# Patient Record
Sex: Female | Born: 1975 | Race: Black or African American | Hispanic: No | Marital: Single | Smoking: Current every day smoker
Health system: Southern US, Community
[De-identification: ages and names within clinical notes are randomized; demographics above are authoritative.]

## PROBLEM LIST (undated history)

## (undated) DIAGNOSIS — I10 Essential (primary) hypertension: Secondary | ICD-10-CM

## (undated) DIAGNOSIS — G43909 Migraine, unspecified, not intractable, without status migrainosus: Secondary | ICD-10-CM

## (undated) HISTORY — PX: TUBAL LIGATION: SHX77

---

## 1998-06-11 ENCOUNTER — Other Ambulatory Visit: Admission: RE | Admit: 1998-06-11 | Discharge: 1998-06-11 | Payer: Self-pay | Admitting: Obstetrics

## 1998-07-04 ENCOUNTER — Ambulatory Visit (HOSPITAL_COMMUNITY): Admission: RE | Admit: 1998-07-04 | Discharge: 1998-07-04 | Payer: Self-pay | Admitting: Obstetrics

## 1998-07-24 ENCOUNTER — Inpatient Hospital Stay (HOSPITAL_COMMUNITY): Admission: AD | Admit: 1998-07-24 | Discharge: 1998-07-24 | Payer: Self-pay | Admitting: Obstetrics

## 1998-10-06 ENCOUNTER — Inpatient Hospital Stay (HOSPITAL_COMMUNITY): Admission: AD | Admit: 1998-10-06 | Discharge: 1998-10-08 | Payer: Self-pay | Admitting: Obstetrics

## 2000-03-12 ENCOUNTER — Emergency Department (HOSPITAL_COMMUNITY): Admission: EM | Admit: 2000-03-12 | Discharge: 2000-03-12 | Payer: Self-pay | Admitting: Emergency Medicine

## 2000-03-17 ENCOUNTER — Emergency Department (HOSPITAL_COMMUNITY): Admission: EM | Admit: 2000-03-17 | Discharge: 2000-03-17 | Payer: Self-pay | Admitting: Emergency Medicine

## 2000-03-28 ENCOUNTER — Other Ambulatory Visit: Admission: RE | Admit: 2000-03-28 | Discharge: 2000-03-28 | Payer: Self-pay | Admitting: Obstetrics

## 2000-09-13 ENCOUNTER — Other Ambulatory Visit: Admission: RE | Admit: 2000-09-13 | Discharge: 2000-09-13 | Payer: Self-pay | Admitting: Obstetrics

## 2013-11-28 ENCOUNTER — Emergency Department: Payer: Self-pay | Admitting: Internal Medicine

## 2013-11-28 LAB — URINALYSIS, COMPLETE
Bacteria: NONE SEEN
Bilirubin,UR: NEGATIVE
Glucose,UR: NEGATIVE mg/dL (ref 0–75)
Leukocyte Esterase: NEGATIVE
Nitrite: NEGATIVE
PH: 6 (ref 4.5–8.0)
Protein: NEGATIVE
RBC,UR: 8 /HPF (ref 0–5)
Specific Gravity: 1.017 (ref 1.003–1.030)
Squamous Epithelial: 3
WBC UR: 2 /HPF (ref 0–5)

## 2013-11-28 LAB — CBC WITH DIFFERENTIAL/PLATELET
BASOS ABS: 0.1 10*3/uL (ref 0.0–0.1)
Basophil %: 0.8 %
EOS ABS: 0.1 10*3/uL (ref 0.0–0.7)
Eosinophil %: 1.1 %
HCT: 39.2 % (ref 35.0–47.0)
HGB: 13.2 g/dL (ref 12.0–16.0)
Lymphocyte #: 3.2 10*3/uL (ref 1.0–3.6)
Lymphocyte %: 42.5 %
MCH: 30.4 pg (ref 26.0–34.0)
MCHC: 33.6 g/dL (ref 32.0–36.0)
MCV: 91 fL (ref 80–100)
MONOS PCT: 5.9 %
Monocyte #: 0.4 x10 3/mm (ref 0.2–0.9)
NEUTROS PCT: 49.7 %
Neutrophil #: 3.8 10*3/uL (ref 1.4–6.5)
Platelet: 298 10*3/uL (ref 150–440)
RBC: 4.34 10*6/uL (ref 3.80–5.20)
RDW: 12.7 % (ref 11.5–14.5)
WBC: 7.6 10*3/uL (ref 3.6–11.0)

## 2013-11-28 LAB — BASIC METABOLIC PANEL
ANION GAP: 6 — AB (ref 7–16)
BUN: 9 mg/dL (ref 7–18)
CHLORIDE: 102 mmol/L (ref 98–107)
CO2: 29 mmol/L (ref 21–32)
Calcium, Total: 8.3 mg/dL — ABNORMAL LOW (ref 8.5–10.1)
Creatinine: 0.59 mg/dL — ABNORMAL LOW (ref 0.60–1.30)
EGFR (Non-African Amer.): >60
Glucose: 74 mg/dL (ref 65–99)
Osmolality: 271 (ref 275–301)
Potassium: 3.3 mmol/L — ABNORMAL LOW (ref 3.5–5.1)
Sodium: 137 mmol/L (ref 136–145)

## 2013-11-28 LAB — TROPONIN I

## 2013-11-28 LAB — PREGNANCY, URINE: PREGNANCY TEST, URINE: NEGATIVE m[IU]/mL

## 2013-11-28 IMAGING — CT CT HEAD WITHOUT CONTRAST
1 series · 16 of 30 positions shown, 20 images · non-contrast
Comparison: None.

CLINICAL DATA: Dizziness

EXAM:
CT HEAD WITHOUT CONTRAST
TECHNIQUE: Contiguous axial images were obtained from the base of the skull
through the vertex without intravenous contrast.

[Series 2: head wo · axial · 0.40mm/px · z∈[+376,+505]mm · 16 of 30 slices shown, 20 images]
[im 2/30  brain]
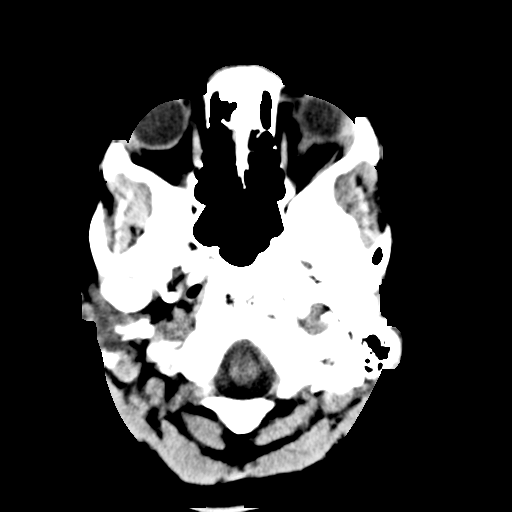
[im 2/30  bone]
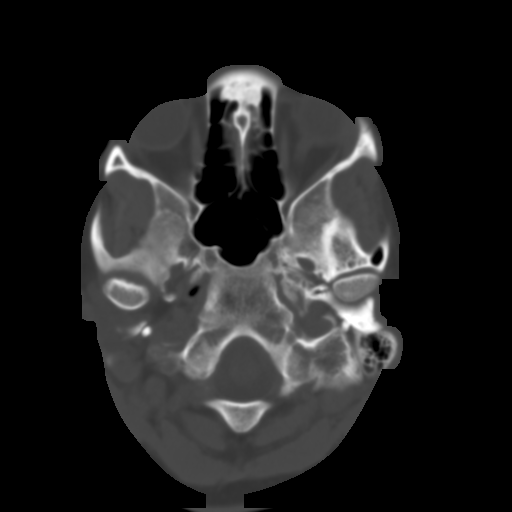
[im 4/30  brain]
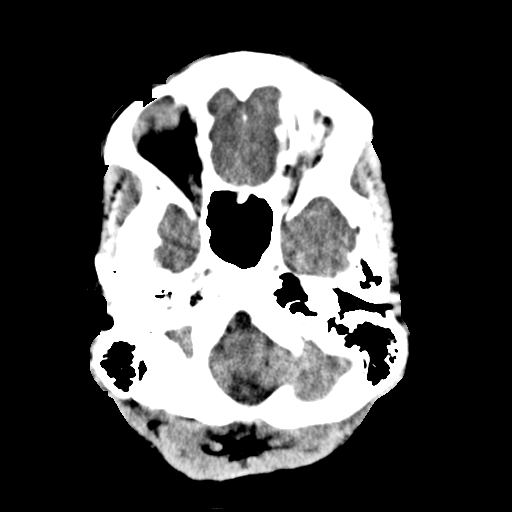
[im 6/30  brain]
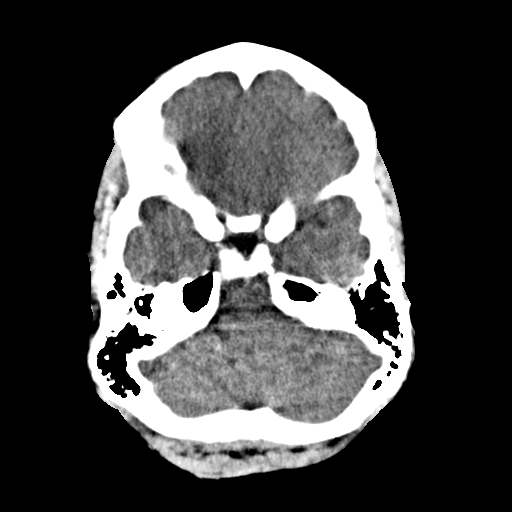
[im 8/30  brain]
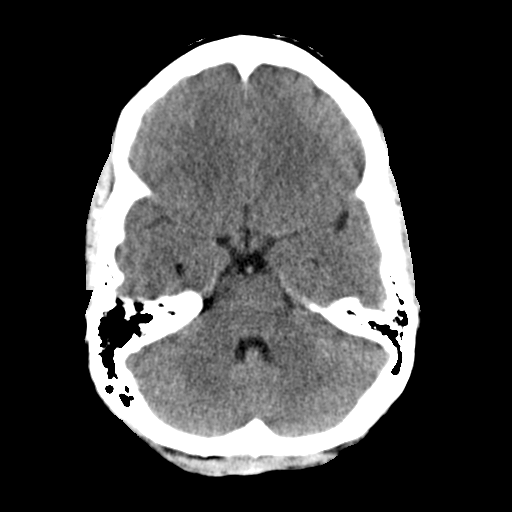
[im 9/30  brain]
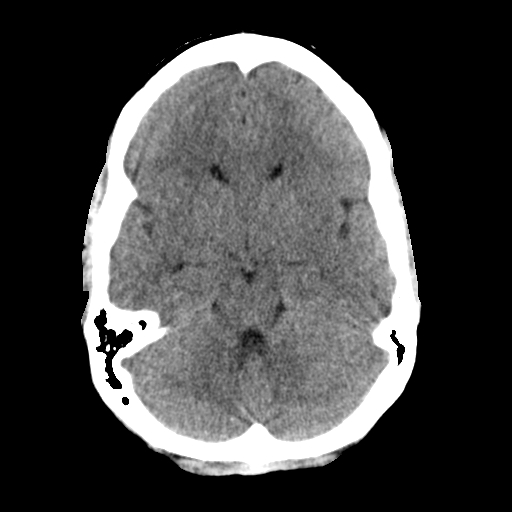
[im 9/30  bone]
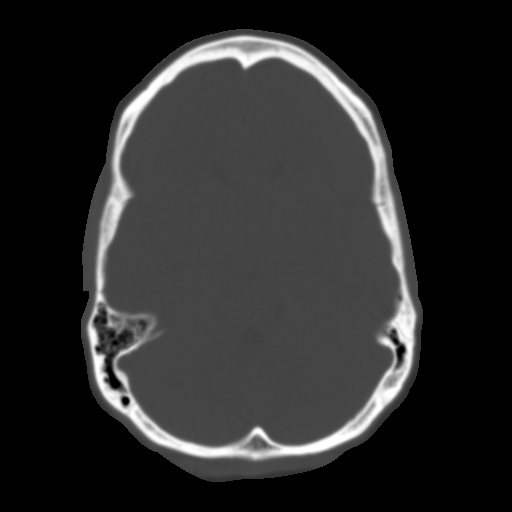
[im 11/30  brain]
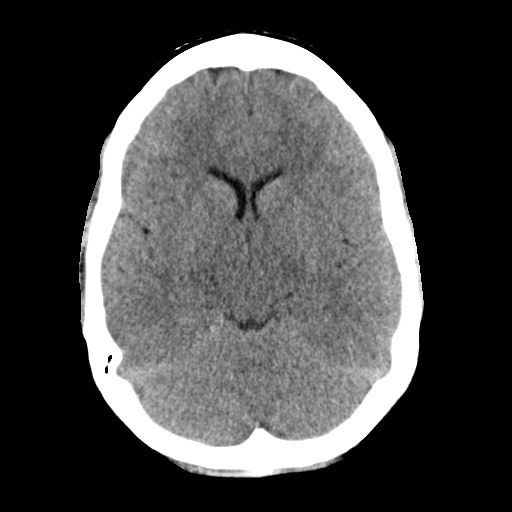
[im 13/30  brain]
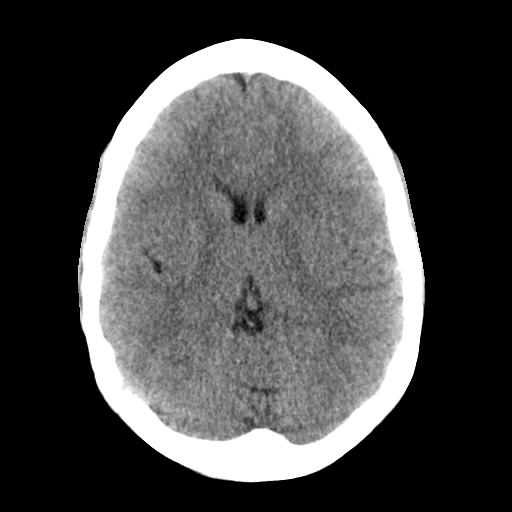
[im 15/30  brain]
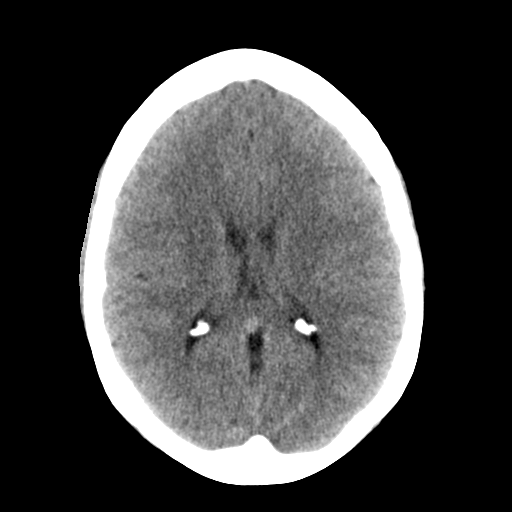
[im 16/30  brain]
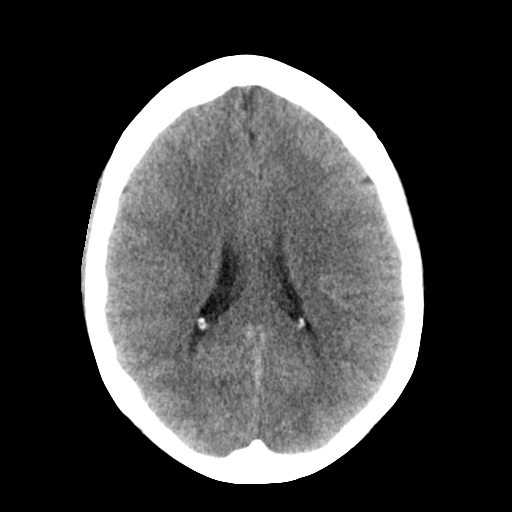
[im 16/30  bone]
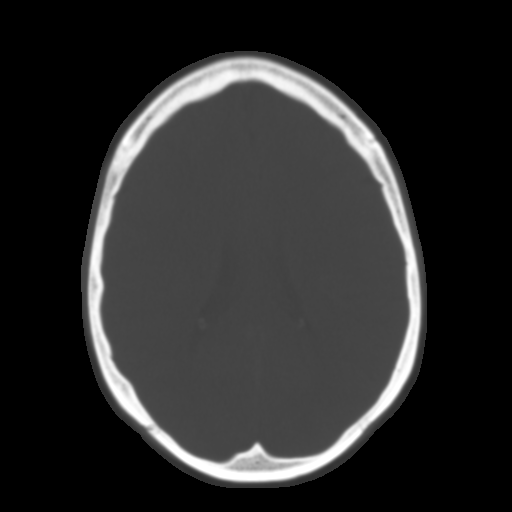
[im 18/30  brain]
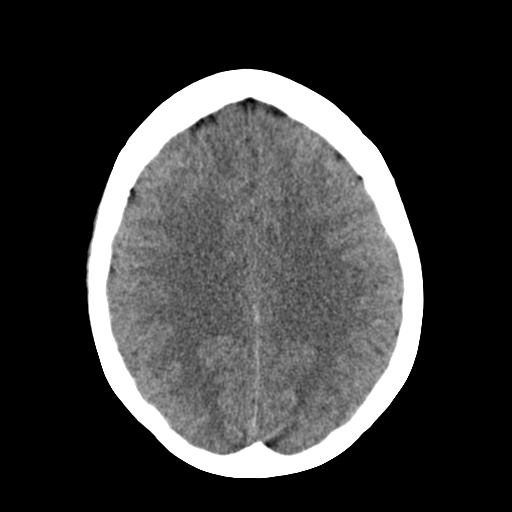
[im 20/30  brain]
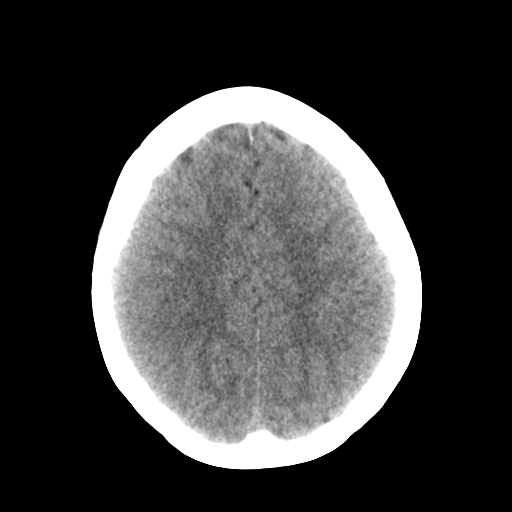
[im 22/30  brain]
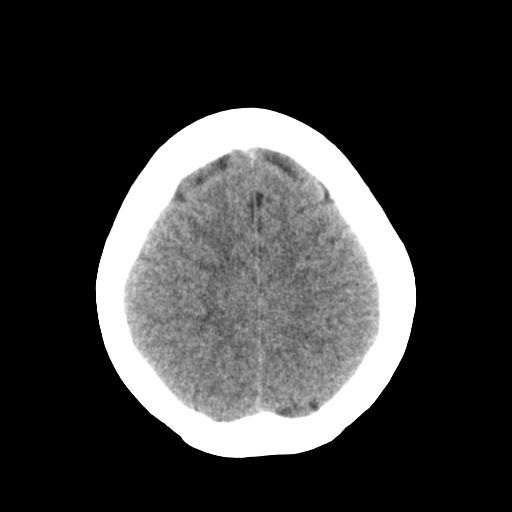
[im 23/30  brain]
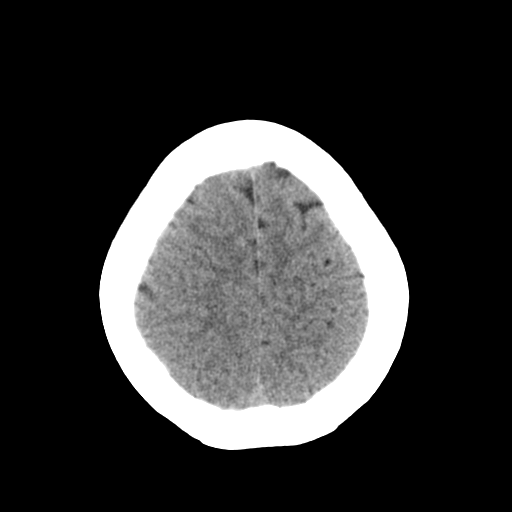
[im 23/30  bone]
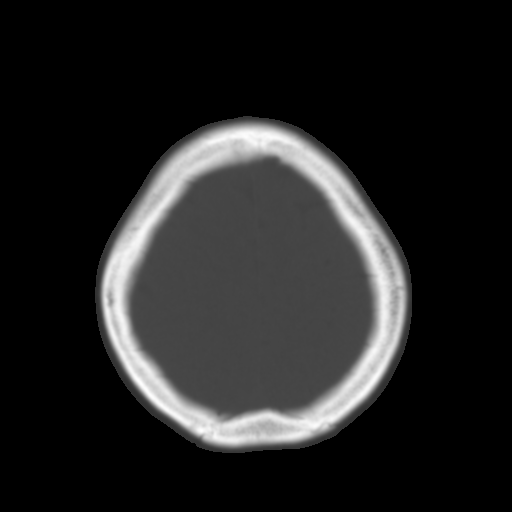
[im 25/30  brain]
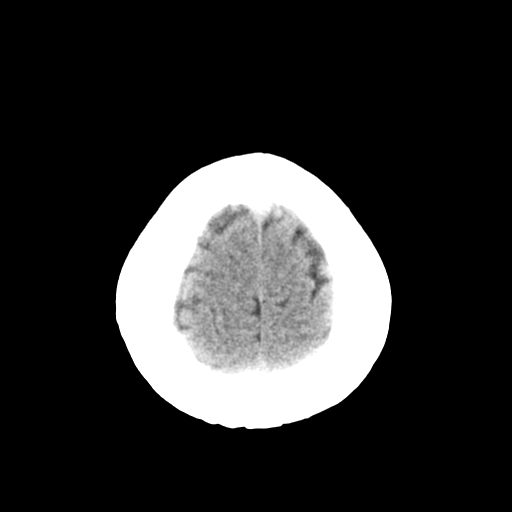
[im 27/30  brain]
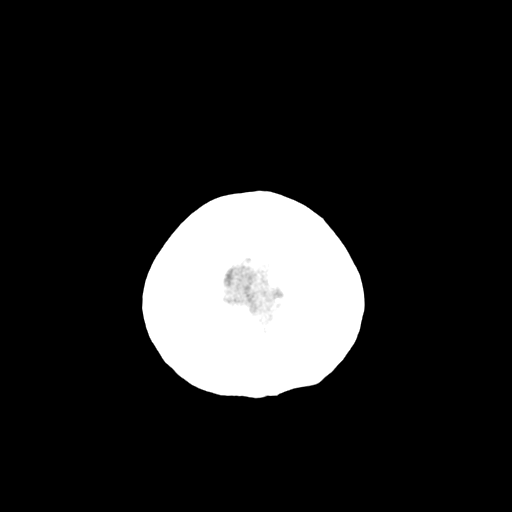
[im 29/30  brain]
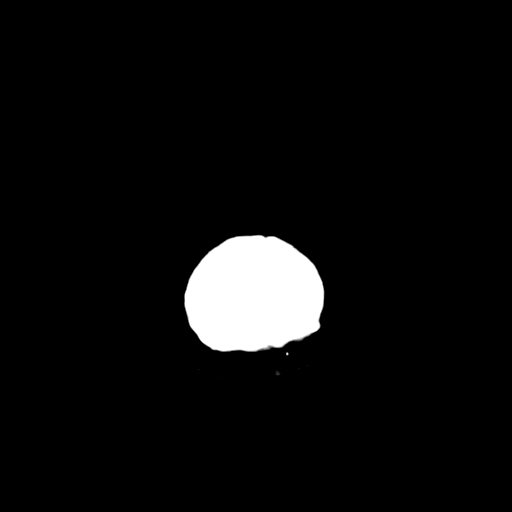

[16 of 30 positions shown; findings below may reference images not displayed]

FINDINGS: The ventricles are normal in size and configuration. There is no
mass, hemorrhage, extra-axial fluid collection, or midline shift.
The gray-white compartments appear normal. No demonstrable acute
infarct. Bony calvarium appears intact. The mastoid air cells are
clear.
IMPRESSION: Study within normal limits.

## 2014-07-17 ENCOUNTER — Emergency Department: Payer: Self-pay | Admitting: Emergency Medicine

## 2014-07-17 LAB — CBC
HCT: 39.4 % (ref 35.0–47.0)
HGB: 12.9 g/dL (ref 12.0–16.0)
MCH: 30.2 pg (ref 26.0–34.0)
MCHC: 32.8 g/dL (ref 32.0–36.0)
MCV: 92 fL (ref 80–100)
Platelet: 280 10*3/uL (ref 150–440)
RBC: 4.28 10*6/uL (ref 3.80–5.20)
RDW: 13.8 % (ref 11.5–14.5)
WBC: 7.6 10*3/uL (ref 3.6–11.0)

## 2014-07-17 LAB — BASIC METABOLIC PANEL
Anion Gap: 8 (ref 7–16)
BUN: 9 mg/dL (ref 7–18)
Calcium, Total: 8.3 mg/dL — ABNORMAL LOW (ref 8.5–10.1)
Chloride: 105 mmol/L (ref 98–107)
Co2: 27 mmol/L (ref 21–32)
Creatinine: 0.74 mg/dL (ref 0.60–1.30)
EGFR (African American): >60
GLUCOSE: 79 mg/dL (ref 65–99)
Osmolality: 277 (ref 275–301)
POTASSIUM: 3.7 mmol/L (ref 3.5–5.1)
SODIUM: 140 mmol/L (ref 136–145)

## 2014-07-17 LAB — TROPONIN I: Troponin-I: <0.02 ng/mL

## 2014-08-13 ENCOUNTER — Emergency Department: Payer: Self-pay | Admitting: Internal Medicine

## 2014-08-13 IMAGING — CR DG CHEST 2V
1 series · 2 of 2 positions shown · non-contrast
Comparison: None.

CLINICAL DATA: Chest pain starting yesterday

EXAM:
CHEST  2 VIEW

[Series 1: dxr chest pa (or ap) and lateral · 0.14mm/px · 2 of 2 slices shown]
[im 1/2]
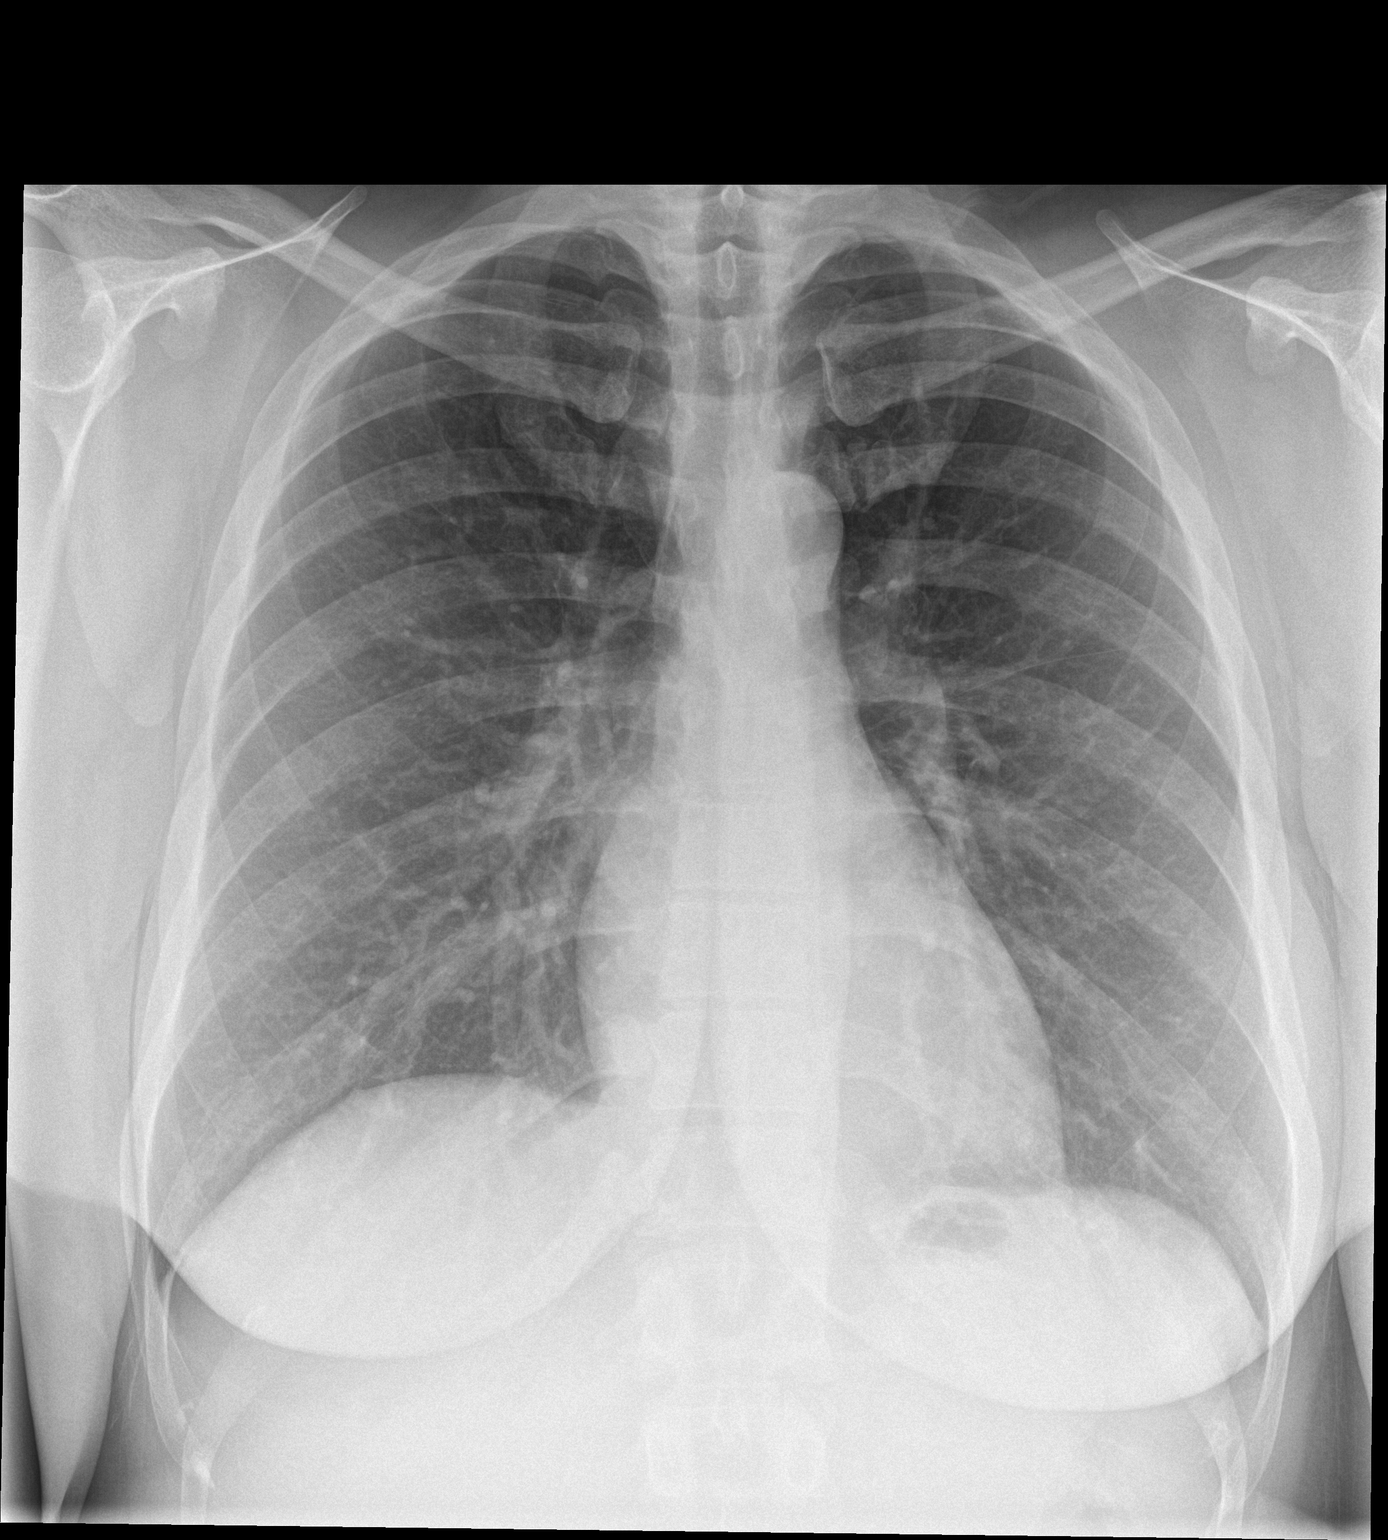
[im 2/2]
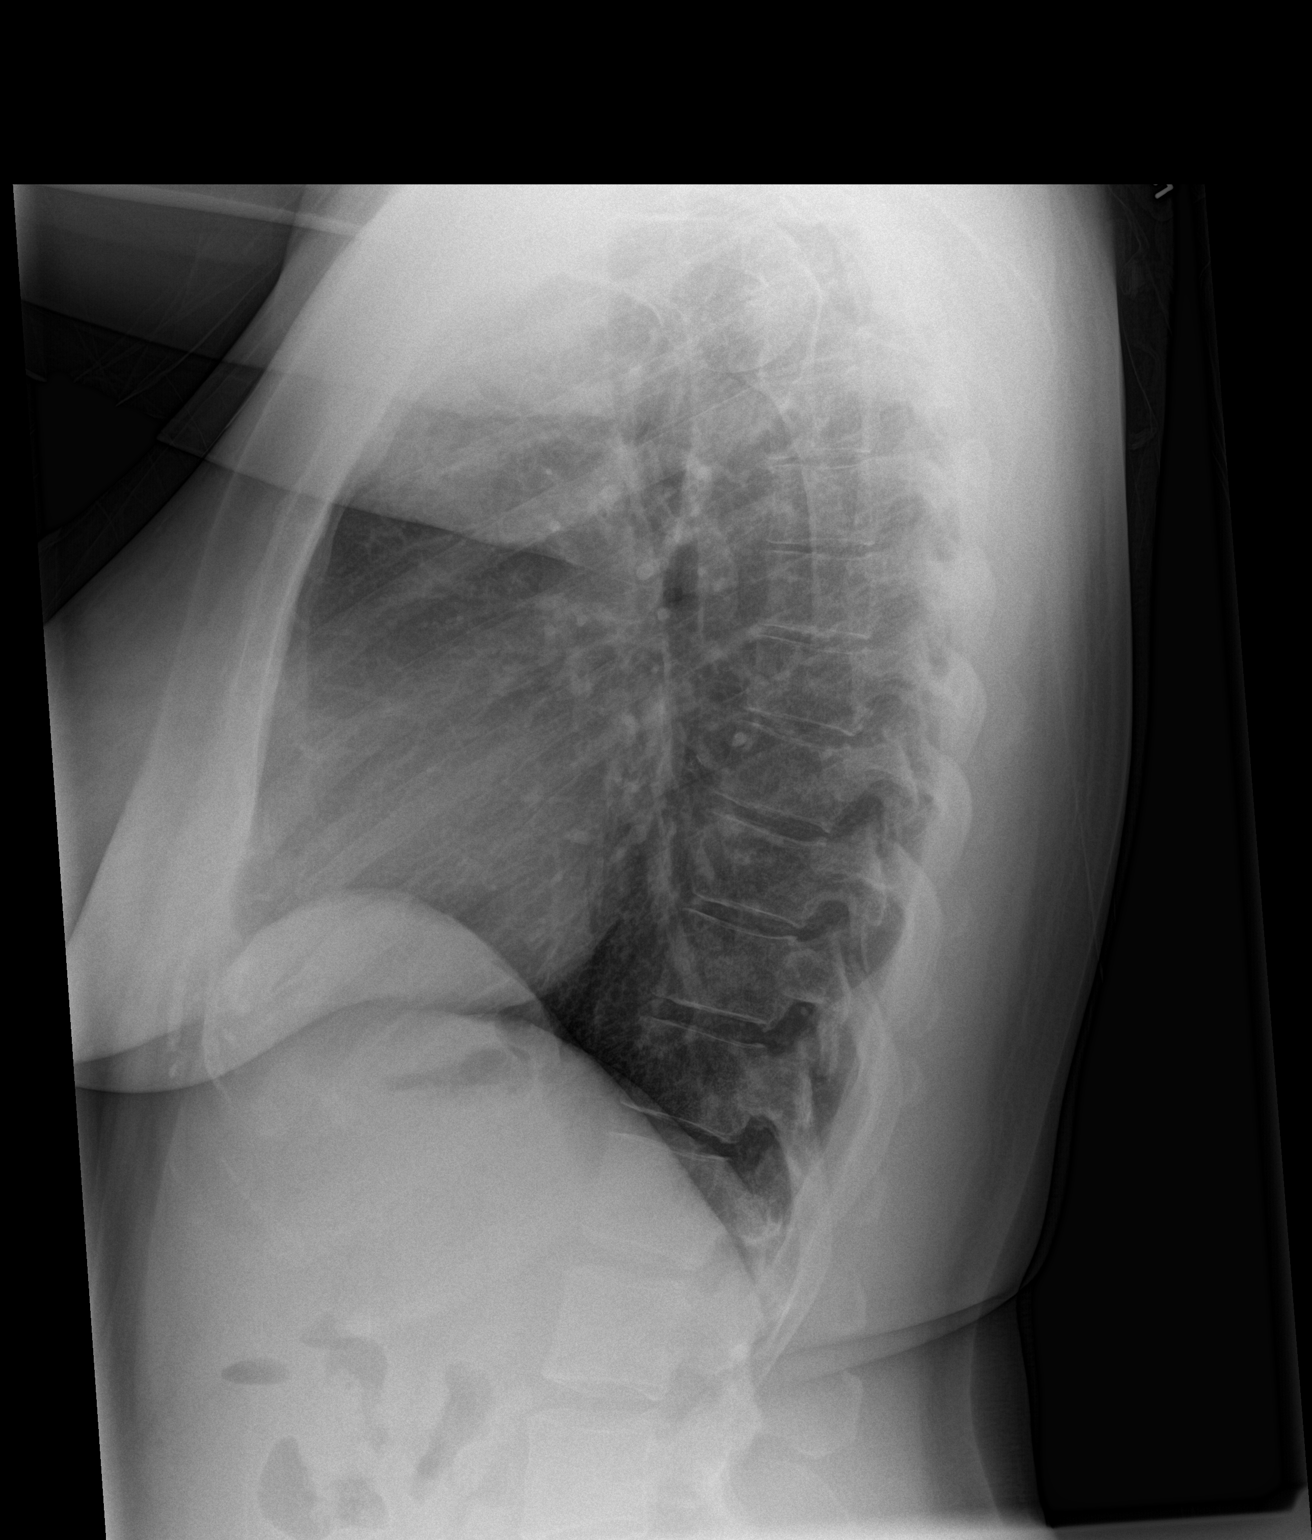

[2 of 2 positions shown; findings below may reference images not displayed]

FINDINGS: Cardiomediastinal silhouette is unremarkable. No acute infiltrate or
pleural effusion. No pulmonary edema. Bony thorax is unremarkable.
IMPRESSION: No active cardiopulmonary disease.

## 2014-08-13 IMAGING — US US EXTREM LOW VENOUS BILAT
1 series · 13 of 24 positions shown · non-contrast
Comparison: None.

CLINICAL DATA: Acute onset of bilateral lower extremity pain since
yesterday. Initial encounter.



[Series 1: us extrem low venous bilat · 0.08mm/px · 13 of 52 slices shown]
[im 1/52]
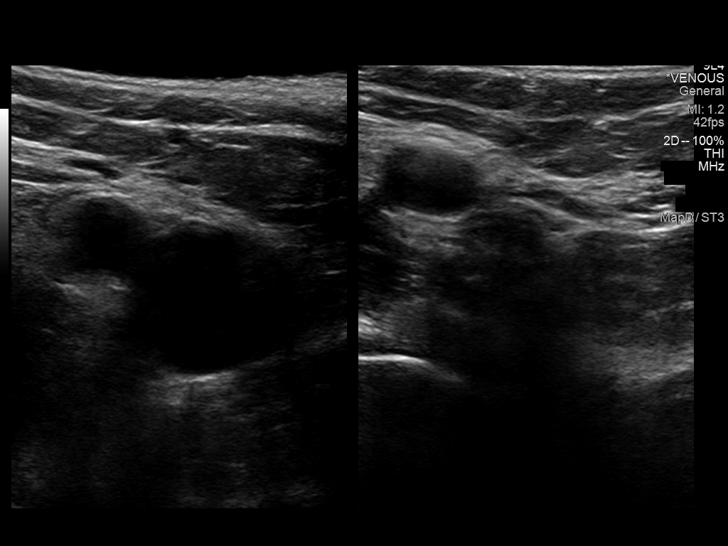
[im 5/52]
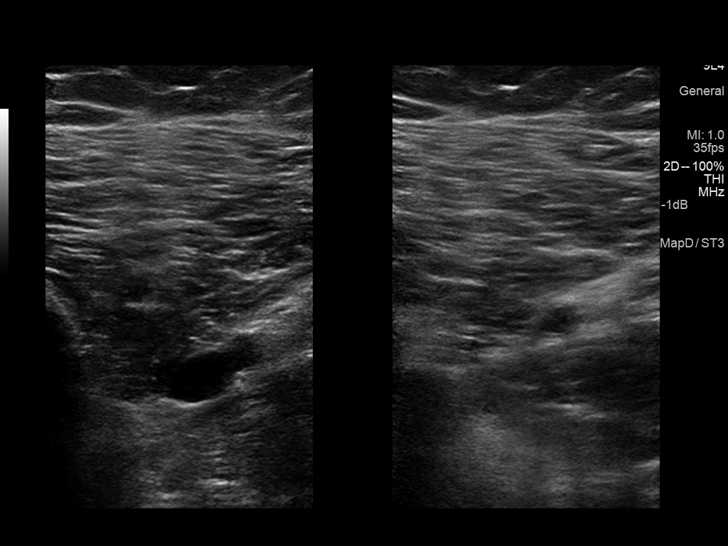
[im 9/52]
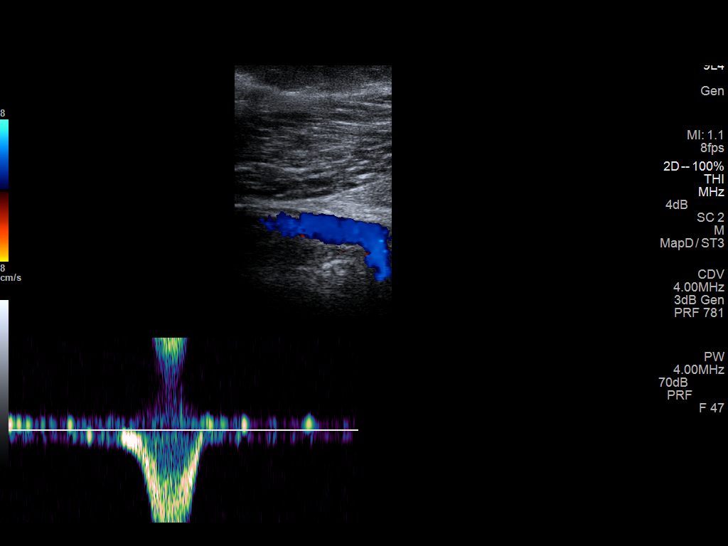
[im 14/52]
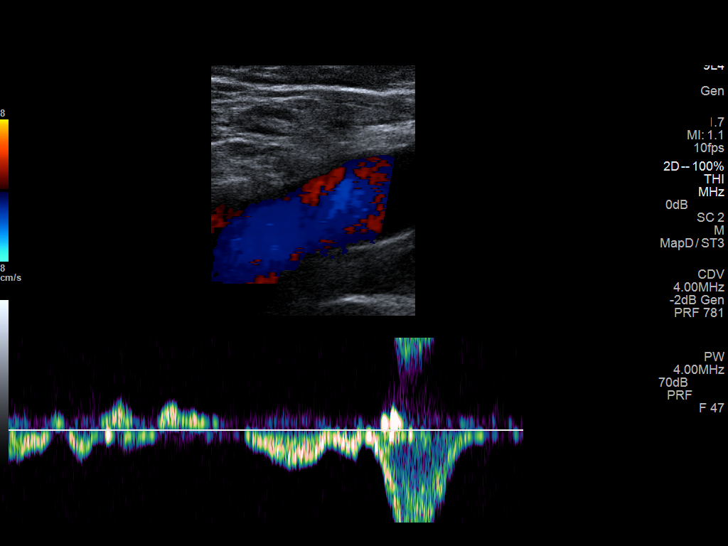
[im 18/52]
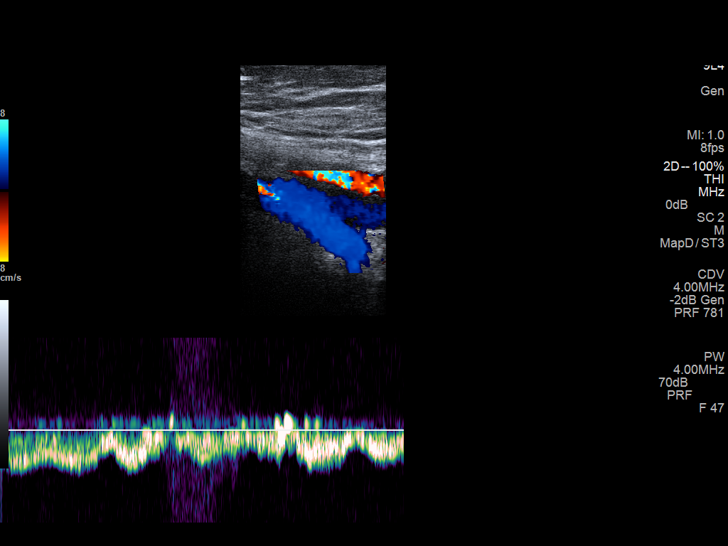
[im 23/52]
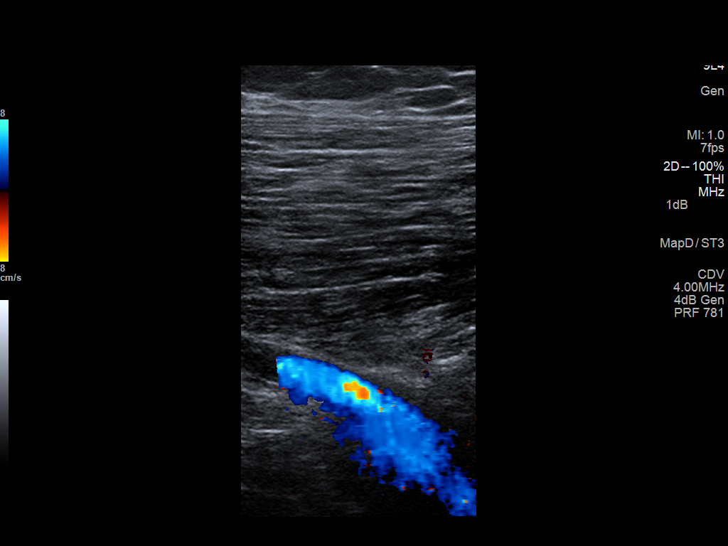
[im 27/52]
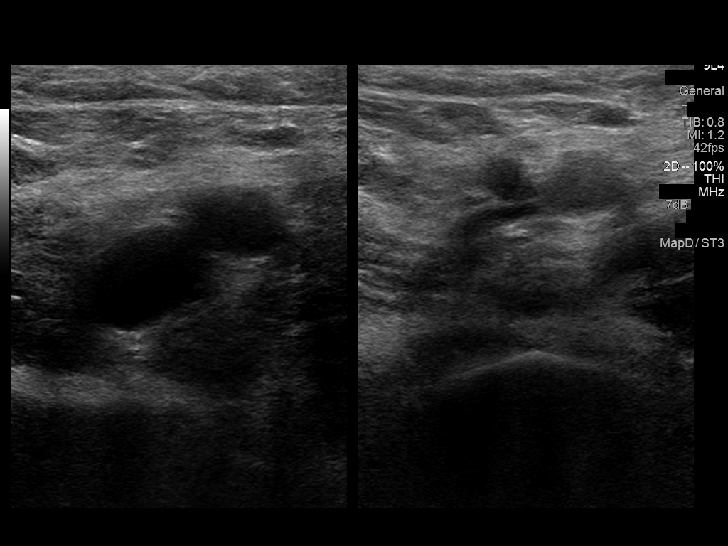
[im 29/52]
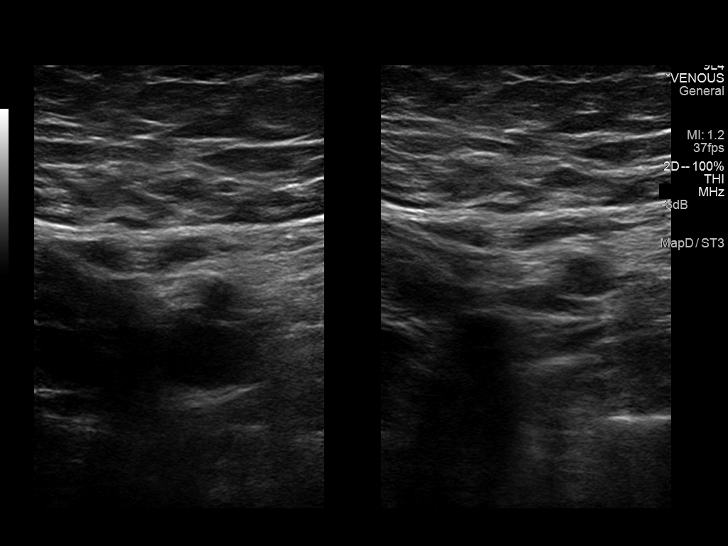
[im 34/52]
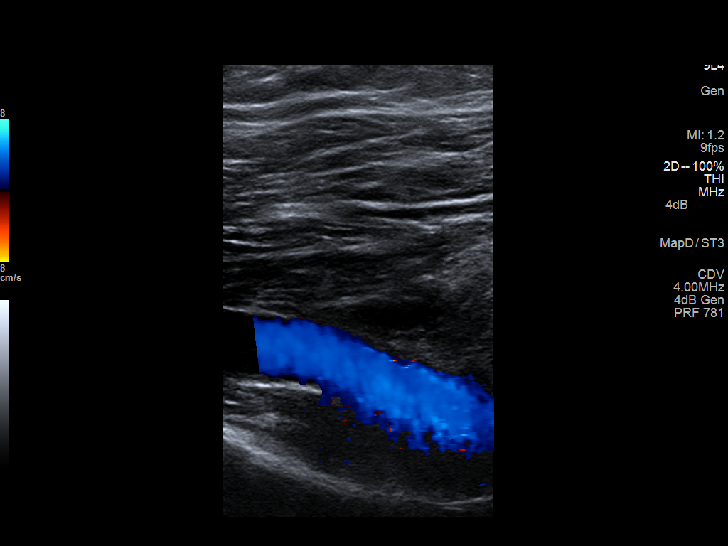
[im 38/52]
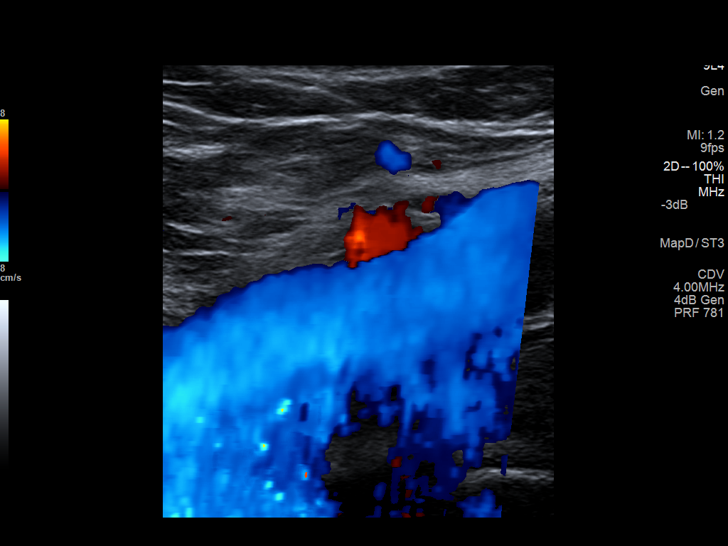
[im 43/52]
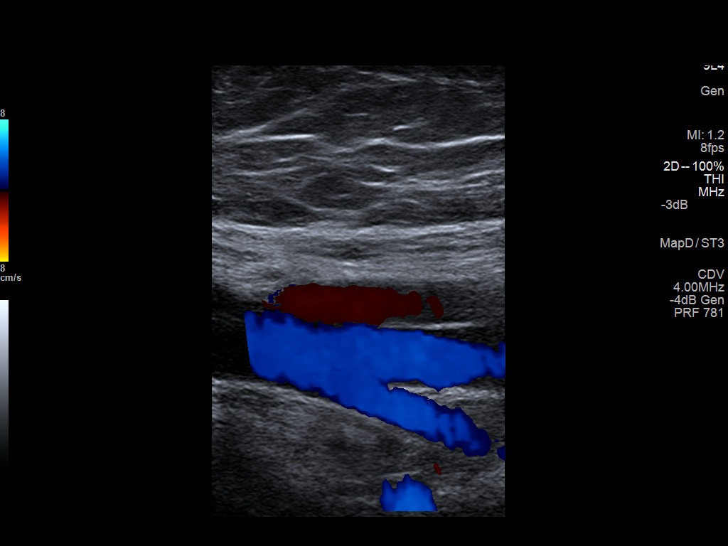
[im 47/52]
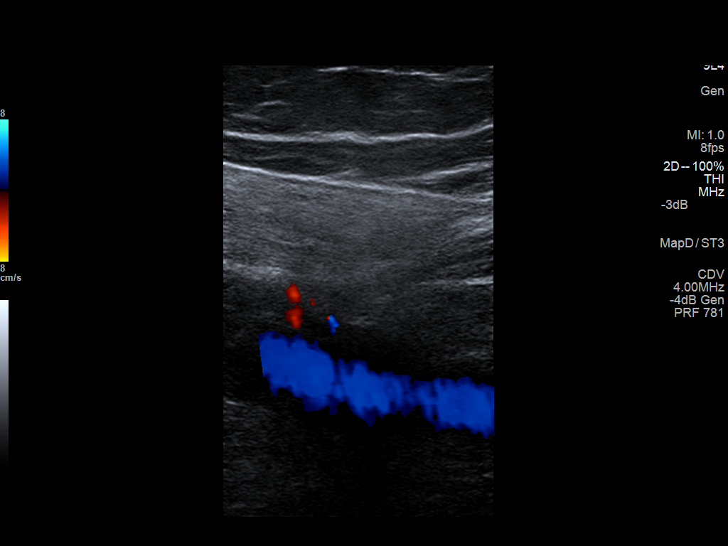
[im 52/52]
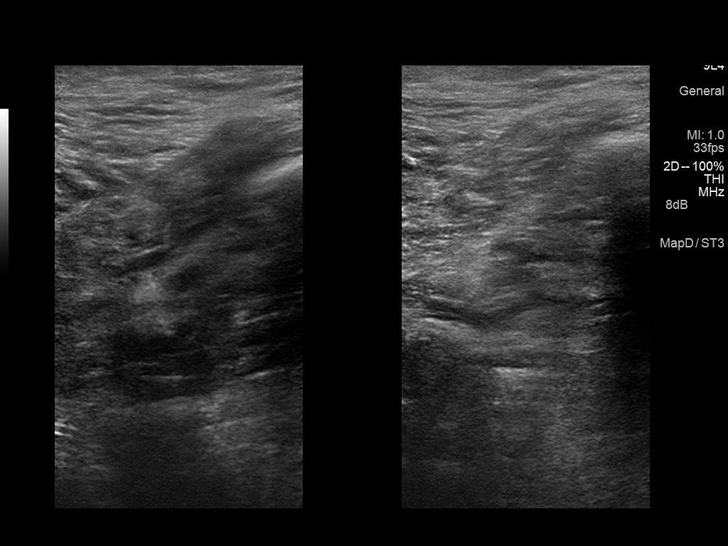

[13 of 24 positions shown; findings below may reference images not displayed]

FINDINGS: RIGHT LOWER EXTREMITY

Common Femoral Vein: No evidence of thrombus. Normal
compressibility, respiratory phasicity and response to augmentation.

Saphenofemoral Junction: No evidence of thrombus. Normal
compressibility and flow on color Doppler imaging.

Profunda Femoral Vein: No evidence of thrombus. Normal
compressibility and flow on color Doppler imaging.

Femoral Vein: No evidence of thrombus. Normal compressibility,
respiratory phasicity and response to augmentation.

Popliteal Vein: No evidence of thrombus. Normal compressibility,
respiratory phasicity and response to augmentation.

Calf Veins: No evidence of thrombus. Normal compressibility and flow
on color Doppler imaging.

Superficial Great Saphenous Vein: No evidence of thrombus. Normal
compressibility and flow on color Doppler imaging.

Venous Reflux:  None.

Other Findings:  None.

LEFT LOWER EXTREMITY

Common Femoral Vein: No evidence of thrombus. Normal
compressibility, respiratory phasicity and response to augmentation.

Saphenofemoral Junction: No evidence of thrombus. Normal
compressibility and flow on color Doppler imaging.

Profunda Femoral Vein: No evidence of thrombus. Normal
compressibility and flow on color Doppler imaging.

Femoral Vein: No evidence of thrombus. Normal compressibility,
respiratory phasicity and response to augmentation.

Popliteal Vein: No evidence of thrombus. Normal compressibility,
respiratory phasicity and response to augmentation.

Calf Veins: No evidence of thrombus. Normal compressibility and flow
on color Doppler imaging.

Superficial Great Saphenous Vein: No evidence of thrombus. Normal
compressibility and flow on color Doppler imaging.

Venous Reflux:  None.

Other Findings:  None.
IMPRESSION: No evidence of deep venous thrombosis.

## 2014-08-26 ENCOUNTER — Emergency Department: Payer: Self-pay | Admitting: Emergency Medicine
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
## 2014-11-25 ENCOUNTER — Encounter: Payer: Self-pay | Admitting: Emergency Medicine

## 2014-11-25 ENCOUNTER — Emergency Department
Admission: EM | Admit: 2014-11-25 | Discharge: 2014-11-25 | Disposition: A | Discharge status: Home / Self Care | Payer: Self-pay | Attending: Emergency Medicine | Admitting: Emergency Medicine

## 2014-11-25 DIAGNOSIS — I10 Essential (primary) hypertension: Secondary | ICD-10-CM | POA: Insufficient documentation

## 2014-11-25 DIAGNOSIS — Z720 Tobacco use: Secondary | ICD-10-CM | POA: Insufficient documentation

## 2014-11-25 DIAGNOSIS — M7918 Myalgia, other site: Secondary | ICD-10-CM

## 2014-11-25 DIAGNOSIS — S8992XD Unspecified injury of left lower leg, subsequent encounter: Secondary | ICD-10-CM | POA: Insufficient documentation

## 2014-11-25 DIAGNOSIS — S161XXD Strain of muscle, fascia and tendon at neck level, subsequent encounter: Secondary | ICD-10-CM | POA: Insufficient documentation

## 2014-11-25 HISTORY — DX: Essential (primary) hypertension: I10

## 2014-11-25 MED ORDER — PREDNISONE 10 MG PO TABS: ORAL_TABLET | ORAL | Status: DC

## 2014-11-25 MED ORDER — TRAMADOL HCL 50 MG PO TABS: 50.0000 mg | ORAL_TABLET | Freq: Four times a day (QID) | ORAL | Status: AC | PRN

## 2014-11-25 MED ORDER — CYCLOBENZAPRINE HCL 10 MG PO TABS: 10.0000 mg | ORAL_TABLET | Freq: Three times a day (TID) | ORAL | Status: DC | PRN

## 2014-11-25 MED ORDER — IBUPROFEN 800 MG PO TABS: 800.0000 mg | ORAL_TABLET | Freq: Three times a day (TID) | ORAL | Status: DC | PRN
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
## 2014-12-06 ENCOUNTER — Emergency Department
Admission: EM | Admit: 2014-12-06 | Discharge: 2014-12-06 | Disposition: A | Discharge status: Home / Self Care | Payer: Self-pay | Attending: Emergency Medicine | Admitting: Emergency Medicine

## 2014-12-06 ENCOUNTER — Encounter: Payer: Self-pay | Admitting: *Deleted

## 2014-12-06 DIAGNOSIS — Z7952 Long term (current) use of systemic steroids: Secondary | ICD-10-CM | POA: Insufficient documentation

## 2014-12-06 DIAGNOSIS — Z720 Tobacco use: Secondary | ICD-10-CM | POA: Insufficient documentation

## 2014-12-06 DIAGNOSIS — Z79899 Other long term (current) drug therapy: Secondary | ICD-10-CM | POA: Insufficient documentation

## 2014-12-06 DIAGNOSIS — I10 Essential (primary) hypertension: Secondary | ICD-10-CM | POA: Insufficient documentation

## 2014-12-06 DIAGNOSIS — N760 Acute vaginitis: Secondary | ICD-10-CM | POA: Insufficient documentation

## 2014-12-06 DIAGNOSIS — Z3202 Encounter for pregnancy test, result negative: Secondary | ICD-10-CM | POA: Insufficient documentation

## 2014-12-06 LAB — URINALYSIS COMPLETE WITH MICROSCOPIC (ARMC ONLY)
BILIRUBIN URINE: NEGATIVE
Bacteria, UA: NONE SEEN
Glucose, UA: NEGATIVE mg/dL
KETONES UR: NEGATIVE mg/dL
Leukocytes, UA: NEGATIVE
Nitrite: NEGATIVE
Protein, ur: NEGATIVE mg/dL
SQUAMOUS EPITHELIAL / LPF: NONE SEEN
Specific Gravity, Urine: 1.013 (ref 1.005–1.030)
pH: 6 (ref 5.0–8.0)

## 2014-12-06 LAB — CHLAMYDIA/NGC RT PCR (ARMC ONLY)
Chlamydia Tr: NOT DETECTED
N GONORRHOEAE: NOT DETECTED

## 2014-12-06 LAB — WET PREP, GENITAL
Clue Cells Wet Prep HPF POC: NONE SEEN
TRICH WET PREP: NONE SEEN
Yeast Wet Prep HPF POC: NONE SEEN

## 2014-12-06 LAB — POCT PREGNANCY, URINE: PREG TEST UR: NEGATIVE

## 2014-12-06 MED ORDER — METRONIDAZOLE 500 MG PO TABS: 500.0000 mg | ORAL_TABLET | Freq: Two times a day (BID) | ORAL | Status: AC

## 2014-12-06 NOTE — ED Provider Notes (Signed)
[facility] Emergency Department Provider Note ____________________________________________  Time seen: Approximately 4:22 PM  I have reviewed the triage vital signs and the nursing notes.   HISTORY  Chief Complaint Vaginal Discharge   HPI Roberta Hicks is a 39 y.o. female resents to the emergency department for a one-week history of vaginal discharge. She states that initially it was a thin white discharge and has progressed into a thicker white discharge. She attempted to use Monistat over-the-counter without any improvement. She denies exposure to STDs, that is not completely sure that her partner has only been with her.She denies pelvic pain.   Past Medical History  Diagnosis Date  . Hypertension     There are no active problems to display for this patient.   Past Surgical History  Procedure Laterality Date  . Tubal ligation      Current Outpatient Rx  Name  Route  Sig  Dispense  Refill  . cyclobenzaprine (FLEXERIL) 10 MG tablet   Oral   Take 1 tablet (10 mg total) by mouth every 8 (eight) hours as needed for muscle spasms.   30 tablet   1   . ibuprofen (ADVIL,MOTRIN) 800 MG tablet   Oral   Take 1 tablet (800 mg total) by mouth every 8 (eight) hours as needed.   30 tablet   0   . metroNIDAZOLE (FLAGYL) 500 MG tablet   Oral   Take 1 tablet (500 mg total) by mouth 2 (two) times daily.   14 tablet   0   . predniSONE (DELTASONE) 10 MG tablet      Take 5 tablets daily for 5 days.   25 tablet   0   . traMADol (ULTRAM) 50 MG tablet   Oral   Take 1 tablet (50 mg total) by mouth every 6 (six) hours as needed.   10 tablet   0     Allergies Review of patient's allergies indicates no known allergies.  No family history on file.  Social History History  Substance Use Topics  . Smoking status: Current Some Day Smoker -- 0.10 packs/day    Types: Cigarettes  . Smokeless tobacco: Not on file  . Alcohol Use: 0.6 oz/week    1  Standard drinks or equivalent per week    Review of Systems Constitutional: No fever/chills Cardiovascular: Denies chest pain. Respiratory: Denies shortness of breath or cough. Gastrointestinal: Abdominal pain no., nausea no, vomitingno. Genitourinary: Dysuria no, vaginal discharge yes, thick white with odor. Musculoskeletal: Negative for back pain. Skin: Negative for rash. Neurological: Negative for headaches, focal weakness or numbness.  10-point ROS otherwise negative.  ____________________________________________   PHYSICAL EXAM:  VITAL SIGNS: ED Triage Vitals  Enc Vitals Group     BP 12/06/14 1541 139/97 mmHg     Pulse Rate 12/06/14 1541 91     Resp 12/06/14 1541 20     Temp 12/06/14 1541 99 F (37.2 C)     Temp Source 12/06/14 1541 Oral     SpO2 12/06/14 1541 99 %     Weight 12/06/14 1541 180 lb (81.647 kg)     Height 12/06/14 1541 5\' 7"  (1.702 m)     Head Cir --      Peak Flow --      Pain Score 12/06/14 1542 7     Pain Loc --      Pain Edu? --      Excl. in GC? --    Constitutional: Alert and oriented.  Well appearing and in no acute distress. Eyes: Conjunctivae are normal. PERRL. EOMI. Head: Atraumatic. Nose: No congestion/rhinnorhea. Mouth/Throat: Mucous membranes are moist.  Oropharynx non-erythematous. Neck: No stridor. Cardiovascular: Good peripheral circulation. Respiratory: Normal respiratory effort.  No retractions. Gastrointestinal: Soft and nontender. No distention. No abdominal bruits. Genitourinary: Pelvic exam: Normal exterior visual exam; White exudate on cervix; cervix is closed; no blood from cervix or in vaginal vault; no cervical motion tenderness; no adnexal tenderness Musculoskeletal: No extremity tenderness nor edema.  Neurologic:  Normal speech and language. No gross focal neurologic deficits are appreciated. Speech is normal. No gait instability. Skin:  Skin is warm, dry and intact. No rash noted. Psychiatric: Mood and affect are  normal. Speech and behavior are normal.  ____________________________________________   LABS (all labs ordered are listed, but only abnormal results are displayed)  Labs Reviewed  WET PREP, GENITAL - Abnormal; Notable for the following:    WBC, Wet Prep HPF POC FEW (*)    All other components within normal limits  URINALYSIS COMPLETEWITH MICROSCOPIC ([facility] ONLY) - Abnormal; Notable for the following:    Color, Urine YELLOW (*)    APPearance CLEAR (*)    Hgb urine dipstick 1+ (*)    All other components within normal limits  CHLAMYDIA/NGC RT PCR ([facility] ONLY)  POC URINE PREG, ED  POCT PREGNANCY, URINE   ____________________________________________  RADIOLOGY   ____________________________________________   PROCEDURES  Procedure(s) performed: Pelvic exam--see assessment.  ____________________________________________   INITIAL IMPRESSION / ASSESSMENT AND PLAN / ED COURSE  Pertinent labs & imaging results that were available during my care of the patient were reviewed by me and considered in my medical decision making (see chart for details).  Patient was advised follow-up with the OB/GYN of her choice for symptoms that are not improving over the next week. She was advised to return to emergency department for symptoms that change or worsen if she is unable schedule an appointment. ____________________________________________   FINAL CLINICAL IMPRESSION(S) / ED DIAGNOSES  Final diagnoses:  Vaginitis       Chinita Pester, FNP 12/06/14 1926  Darien Ramus, MD 12/07/14 [phone]

## 2014-12-24 ENCOUNTER — Encounter: Payer: Self-pay | Admitting: Urgent Care

## 2014-12-24 ENCOUNTER — Emergency Department
Admission: EM | Admit: 2014-12-24 | Discharge: 2014-12-24 | Disposition: A | Discharge status: Home / Self Care | Payer: Self-pay | Attending: Emergency Medicine | Admitting: Emergency Medicine

## 2014-12-24 ENCOUNTER — Emergency Department: Payer: Self-pay

## 2014-12-24 DIAGNOSIS — Z3202 Encounter for pregnancy test, result negative: Secondary | ICD-10-CM | POA: Insufficient documentation

## 2014-12-24 DIAGNOSIS — R319 Hematuria, unspecified: Secondary | ICD-10-CM

## 2014-12-24 DIAGNOSIS — Z7952 Long term (current) use of systemic steroids: Secondary | ICD-10-CM | POA: Insufficient documentation

## 2014-12-24 DIAGNOSIS — Z87891 Personal history of nicotine dependence: Secondary | ICD-10-CM | POA: Insufficient documentation

## 2014-12-24 DIAGNOSIS — I10 Essential (primary) hypertension: Secondary | ICD-10-CM | POA: Insufficient documentation

## 2014-12-24 DIAGNOSIS — R102 Pelvic and perineal pain: Secondary | ICD-10-CM

## 2014-12-24 DIAGNOSIS — Z79899 Other long term (current) drug therapy: Secondary | ICD-10-CM | POA: Insufficient documentation

## 2014-12-24 DIAGNOSIS — N390 Urinary tract infection, site not specified: Secondary | ICD-10-CM | POA: Insufficient documentation

## 2014-12-24 LAB — URINALYSIS COMPLETE WITH MICROSCOPIC (ARMC ONLY)
BACTERIA UA: NONE SEEN
Bilirubin Urine: NEGATIVE
Glucose, UA: 50 mg/dL — AB
KETONES UR: NEGATIVE mg/dL
NITRITE: NEGATIVE
Protein, ur: 100 mg/dL — AB
Specific Gravity, Urine: 1.027 (ref 1.005–1.030)
Squamous Epithelial / LPF: NONE SEEN
pH: 5 (ref 5.0–8.0)

## 2014-12-24 LAB — PREGNANCY, URINE: PREG TEST UR: NEGATIVE

## 2014-12-24 IMAGING — CT CT RENAL STONE PROTOCOL
1 of 2 series · 5 of 32 positions shown, 10 images · non-contrast
Comparison: None.

CLINICAL DATA: Dysuria, hematuria, increased urinary frequency.
Burning when voiding. Began at 2 a.m. this morning.

EXAM:
CT ABDOMEN AND PELVIS WITHOUT CONTRAST
TECHNIQUE: Multidetector CT imaging of the abdomen and pelvis was performed
following the standard protocol without IV contrast.

[Series 4: lung windows · axial · 0.67mm/px · z∈[-830,-744]mm · 5 of 27 slices shown, 10 images]
[im 5/27  soft-tissue]
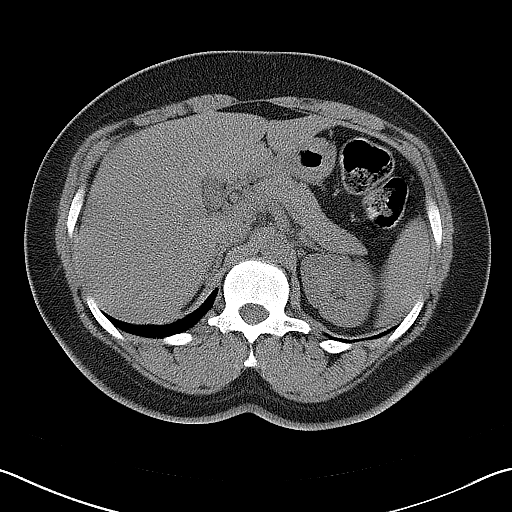
[im 5/27  bone]
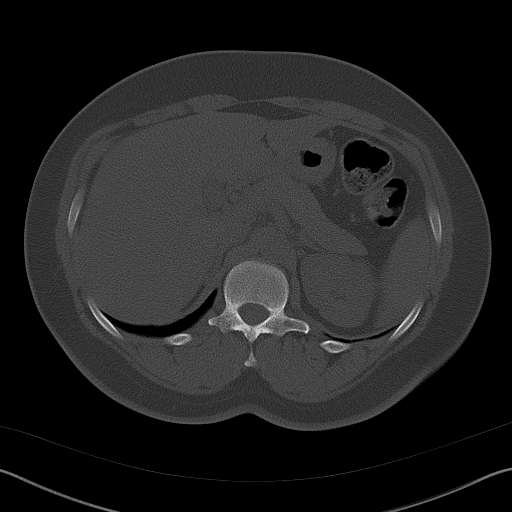
[im 9/27  soft-tissue]
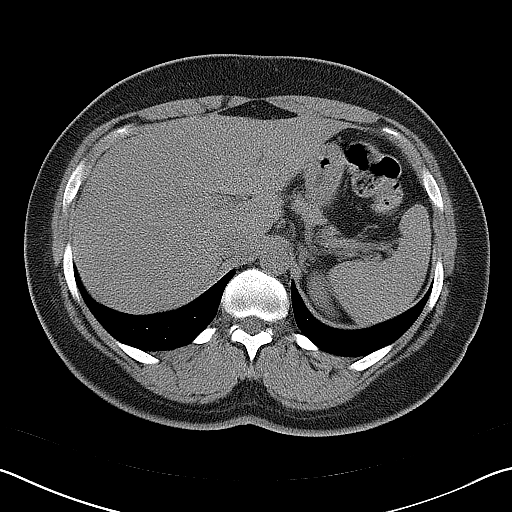
[im 9/27  lung]
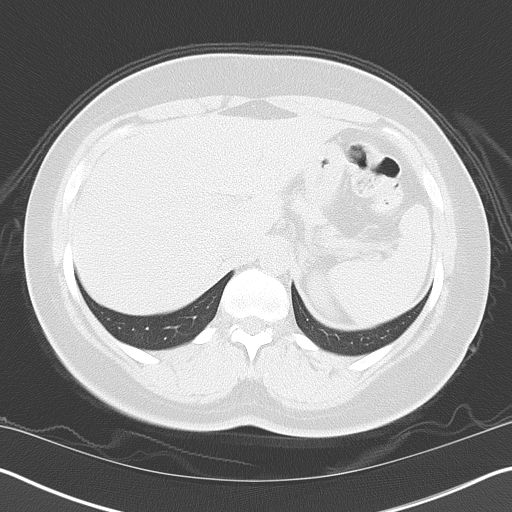
[im 14/27  soft-tissue]
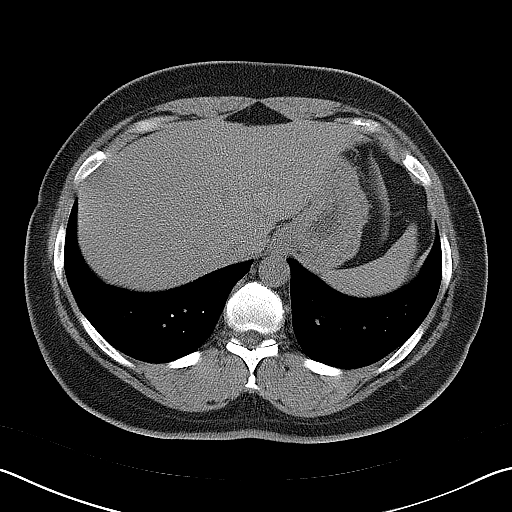
[im 14/27  lung]
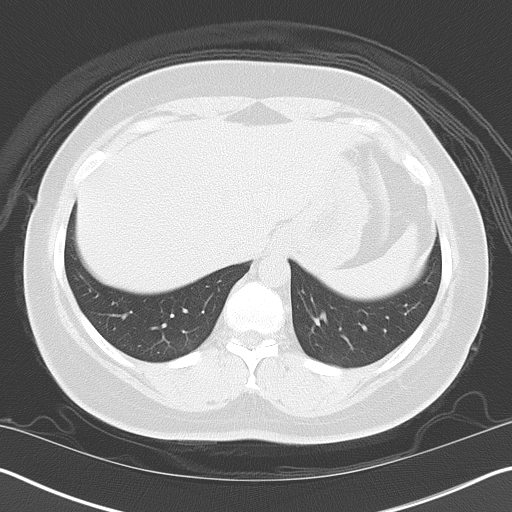
[im 18/27  soft-tissue]
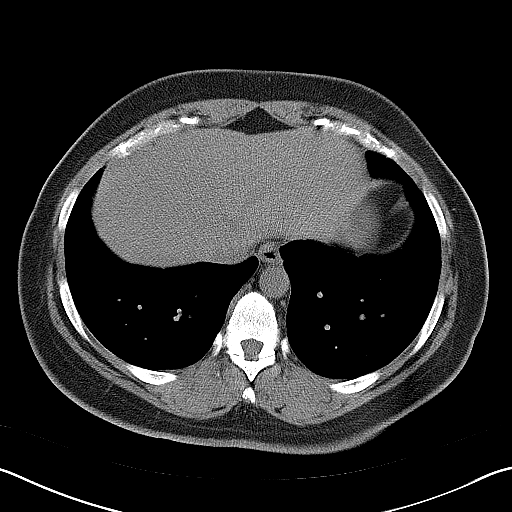
[im 18/27  lung]
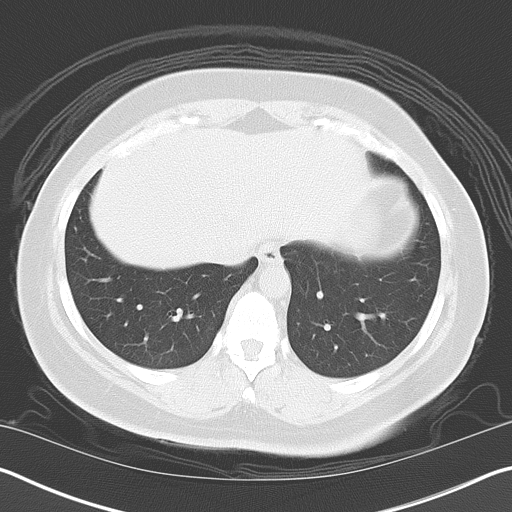
[im 22/27  soft-tissue]
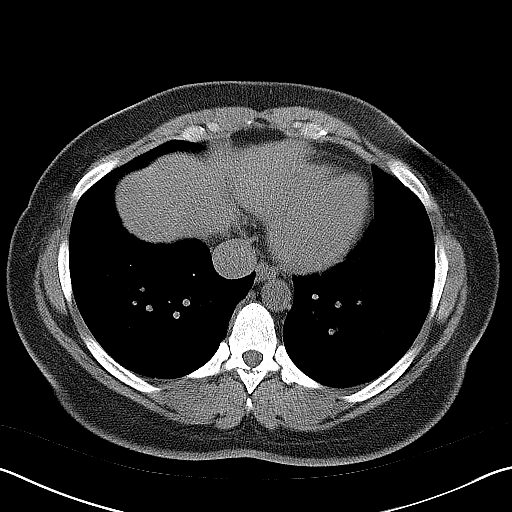
[im 22/27  lung]
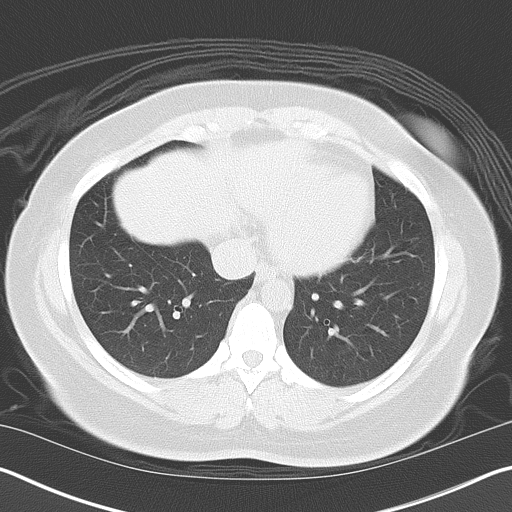

[5 of 32 positions shown; findings below may reference images not displayed]

FINDINGS: The lung bases are clear.

Kidneys are symmetrical in size and shape. No hydronephrosis or
hydroureter. No renal, ureteral, or bladder stones. Bladder is
decompressed.

The unenhanced appearance of the liver, spleen, gallbladder,
pancreas, adrenal glands, abdominal aorta, inferior vena cava, and
retroperitoneal lymph nodes is unremarkable. Stomach, small bowel,
and colon are not abnormally distended. No free air or free fluid in
the abdomen.

Pelvis: Uterus and ovaries are not enlarged. Small amount of free
fluid in the pelvis is likely physiologic. No loculated fluid
collections. No pelvic mass or lymphadenopathy. Appendix is normal.
No destructive bone lesions.
IMPRESSION: No renal, ureteral, or bladder stones identified. Bladder is
decompressed, limiting evaluation for wall thickening. Physiologic
free fluid in the pelvis.

## 2014-12-24 MED ORDER — CEPHALEXIN 500 MG PO CAPS: 500.0000 mg | ORAL_CAPSULE | Freq: Three times a day (TID) | ORAL | Status: DC

## 2014-12-24 MED ORDER — LIDOCAINE HCL (PF) 1 % IJ SOLN
2.1000 mL | Freq: Once | INTRAMUSCULAR | Status: AC
  Administered 2014-12-24: 2.1 mL

## 2014-12-24 MED ORDER — PHENAZOPYRIDINE HCL 95 MG PO TABS: ORAL_TABLET | ORAL | Status: DC

## 2014-12-24 MED ORDER — CEFTRIAXONE SODIUM 1 G IJ SOLR
1.0000 g | Freq: Once | INTRAMUSCULAR | Status: AC
  Administered 2014-12-24: 1 g via INTRAMUSCULAR
  Filled 2014-12-24: qty 10

## 2014-12-24 MED ORDER — LIDOCAINE HCL (PF) 1 % IJ SOLN
INTRAMUSCULAR | Status: AC
  Administered 2014-12-24: 2.1 mL
  Filled 2014-12-24: qty 5

## 2014-12-24 MED ORDER — KETOROLAC TROMETHAMINE 60 MG/2ML IM SOLN
60.0000 mg | Freq: Once | INTRAMUSCULAR | Status: AC
  Administered 2014-12-24: 60 mg via INTRAMUSCULAR
  Filled 2014-12-24: qty 2

## 2014-12-24 NOTE — ED Notes (Signed)

## 2014-12-24 NOTE — ED Notes (Signed)
Dr. Forbach at bedside.  

## 2014-12-24 NOTE — ED Notes (Signed)
Patient presents with c/o dysuria with (+) gross hematuria that began this am at 0200.

## 2014-12-24 NOTE — ED Notes (Addendum)
Patient present to ED with dysuria, hematuria, increased urinary frequency, patient reports "burning sensation when voiding" beginning at 2:00 am this morning. Patient states "I have a lot of blood clots coming out." LMP 12/16/2014, patient reports had similar symptoms in the past, reports was diagnosed with UTI. Patient reports lower abdominal pain when voiding, but denies abdominal pain at other times. Patient denies chest pain, shortness of breath, fever, or dizziness. Patient alert and oriented x 4, respirations even and unlabored, call bell within reach.

## 2014-12-24 NOTE — Discharge Instructions (Signed)
We believe that the pain your having when you urinate in the blood is a result of a urinary tract infection.  We have given you a prescription for antibiotics (cephalexin) and a medication that will help with the discomfort when you urinate (phenazopyridine).  Please follow-up with your regular doctor or with [facility] in about 3 days.  Return to the emergency department if he develop new or worsening symptoms that concern you.   Urinary Tract Infection A urinary tract infection (UTI) can occur any place along the urinary tract. The tract includes the kidneys, ureters, bladder, and urethra. A type of germ called bacteria often causes a UTI. UTIs are often helped with antibiotic medicine.  HOME CARE   If given, take antibiotics as told by your doctor. Finish them even if you start to feel better.  Drink enough fluids to keep your pee (urine) clear or pale yellow.  Avoid tea, drinks with caffeine, and bubbly (carbonated) drinks.  Pee often. Avoid holding your pee in for a long time.  Pee before and after having sex (intercourse).  Wipe from front to back after you poop (bowel movement) if you are a woman. Use each tissue only once. GET HELP RIGHT AWAY IF:   You have back pain.  You have lower belly (abdominal) pain.  You have chills.  You feel sick to your stomach (nauseous).  You throw up (vomit).  Your burning or discomfort with peeing does not go away.  You have a fever.  Your symptoms are not better in 3 days. MAKE SURE YOU:   Understand these instructions.  Will watch your condition.  Will get help right away if you are not doing well or get worse. Document Released: 11/17/2007 Document Revised: 02/23/2012 Document Reviewed: 12/30/2011 [facility] Patient Information 2015 [place], [place]. This information is not intended to replace advice given to you by your health care provider. Make sure you discuss any questions you have with your health care  provider.  Hematuria Hematuria is blood in your urine. It can be caused by a bladder infection, kidney infection, prostate infection, kidney stone, or cancer of your urinary tract. Infections can usually be treated with medicine, and a kidney stone usually will pass through your urine. If neither of these is the cause of your hematuria, further workup to find out the reason may be needed. It is very important that you tell your health care provider about any blood you see in your urine, even if the blood stops without treatment or happens without causing pain. Blood in your urine that happens and then stops and then happens again can be a symptom of a very serious condition. Also, pain is not a symptom in the initial stages of many urinary cancers. HOME CARE INSTRUCTIONS   Drink lots of fluid, 3-4 quarts a day. If you have been diagnosed with an infection, cranberry juice is especially recommended, in addition to large amounts of water.  Avoid caffeine, tea, and carbonated beverages because they tend to irritate the bladder.  Avoid alcohol because it may irritate the prostate.  Take all medicines as directed by your health care provider.  If you were prescribed an antibiotic medicine, finish it all even if you start to feel better.  If you have been diagnosed with a kidney stone, follow your health care provider's instructions regarding straining your urine to catch the stone.  Empty your bladder often. Avoid holding urine for long periods of time.  After a bowel movement,  women should cleanse front to back. Use each tissue only once.  Empty your bladder before and after sexual intercourse if you are a female. SEEK MEDICAL CARE IF:  You develop back pain.  You have a fever.  You have a feeling of sickness in your stomach (nausea) or vomiting.  Your symptoms are not better in 3 days. Return sooner if you are getting worse. SEEK IMMEDIATE MEDICAL CARE IF:   You develop severe  vomiting and are unable to keep the medicine down.  You develop severe back or abdominal pain despite taking your medicines.  You begin passing a large amount of blood or clots in your urine.  You feel extremely weak or faint, or you pass out. MAKE SURE YOU:   Understand these instructions.  Will watch your condition.  Will get help right away if you are not doing well or get worse. Document Released: 05/31/2005 Document Revised: 10/15/2013 Document Reviewed: 01/29/2013 [facility] Patient Information 2015 [place]. This information is not intended to replace advice given to you by your health care provider. Make sure you discuss any questions you have with your health care provider.

## 2014-12-24 NOTE — ED Provider Notes (Signed)
[facility] Emergency Department Provider Note  ____________________________________________  Time seen: Approximately 4:59 AM  I have reviewed the triage vital signs and the nursing notes.   HISTORY  Chief Complaint Hematuria and Dysuria    HPI Roberta Hicks is a 39 y.o. female with no significant past medical history who presents with dysuria and increased urinary frequency for "quite a while" who awoke with sharp suprapubic pain and gross hematuria several hours ago.  She has never had these symptoms in the past.  She describes the pain as severe.  It is not radiating to any other location including no flank pain.  She has not had a history of kidney stones.  She has had a tubal ligation.  She denies fever/chills, chest pain, shortness of breath.  Urination and movement make the pain worse.   Past Medical History  Diagnosis Date  . Hypertension     There are no active problems to display for this patient.   Past Surgical History  Procedure Laterality Date  . Tubal ligation      Current Outpatient Rx  Name  Route  Sig  Dispense  Refill  . cephALEXin (KEFLEX) 500 MG capsule   Oral   Take 1 capsule (500 mg total) by mouth 3 (three) times daily.   36 capsule   0   . cyclobenzaprine (FLEXERIL) 10 MG tablet   Oral   Take 1 tablet (10 mg total) by mouth every 8 (eight) hours as needed for muscle spasms.   30 tablet   1   . ibuprofen (ADVIL,MOTRIN) 800 MG tablet   Oral   Take 1 tablet (800 mg total) by mouth every 8 (eight) hours as needed.   30 tablet   0   . phenazopyridine (PYRIDIUM) 95 MG tablet      Take 2 tablets (190 mg) by mouth three times daily with meals   12 tablet   0   . predniSONE (DELTASONE) 10 MG tablet      Take 5 tablets daily for 5 days.   25 tablet   0   . traMADol (ULTRAM) 50 MG tablet   Oral   Take 1 tablet (50 mg total) by mouth every 6 (six) hours as needed.   10 tablet   0     Allergies Review of  patient's allergies indicates no known allergies.  History reviewed. No pertinent family history.  Social History History  Substance Use Topics  . Smoking status: Former Smoker -- 0.10 packs/day    Types: Cigarettes  . Smokeless tobacco: Not on file  . Alcohol Use: 0.6 oz/week    1 Standard drinks or equivalent per week    Review of Systems Constitutional: No fever/chills Eyes: No visual changes. ENT: No sore throat. Cardiovascular: Denies chest pain. Respiratory: Denies shortness of breath. Gastrointestinal: Suprapubic abdominal pain.  No nausea, no vomiting.  No diarrhea.  No constipation. Genitourinary: Dysuria and increased frequency  Musculoskeletal: Negative for back pain. Skin: Negative for rash. Neurological: Negative for headaches, focal weakness or numbness.  10-point ROS otherwise negative.  ____________________________________________   PHYSICAL EXAM:  VITAL SIGNS: ED Triage Vitals  Enc Vitals Group     BP 12/24/14 0406 151/93 mmHg     Pulse Rate 12/24/14 0406 86     Resp 12/24/14 0406 18     Temp 12/24/14 0406 97.6 F (36.4 C)     Temp Source 12/24/14 0406 Oral     SpO2 12/24/14 0406 100 %  Weight 12/24/14 0406 180 lb (81.647 kg)     Height 12/24/14 0406 5\' 7"  (1.702 m)     Head Cir --      Peak Flow --      Pain Score 12/24/14 0407 10     Pain Loc --      Pain Edu? --      Excl. in [place]? --     Constitutional: Alert and oriented. Well appearing but does appear uncomfortable. Eyes: Conjunctivae are normal. PERRL. EOMI. Head: Atraumatic. Nose: No congestion/rhinnorhea. Mouth/Throat: Mucous membranes are moist.  Oropharynx non-erythematous. Neck: No stridor.   Cardiovascular: Normal rate, regular rhythm. Grossly normal heart sounds.  Good peripheral circulation. Respiratory: Normal respiratory effort.  No retractions. Lungs CTAB. Gastrointestinal: Soft with mild tenderness to palpation of the suprapubic region. No distention. No abdominal  bruits. No CVA tenderness. Musculoskeletal: No lower extremity tenderness nor edema.  No joint effusions. Neurologic:  Normal speech and language. No gross focal neurologic deficits are appreciated. Speech is normal. Skin:  Skin is warm, dry and intact. No rash noted. Psychiatric: Mood and affect are normal. Speech and behavior are normal.  ____________________________________________   LABS (all labs ordered are listed, but only abnormal results are displayed)  Labs Reviewed  URINALYSIS COMPLETEWITH MICROSCOPIC ([facility] ONLY) - Abnormal; Notable for the following:    Color, Urine RED (*)    APPearance TURBID (*)    Glucose, UA 50 (*)    Hgb urine dipstick 3+ (*)    Protein, ur 100 (*)    Leukocytes, UA 1+ (*)    All other components within normal limits  URINE CULTURE  PREGNANCY, URINE  Urine WBCs = TNTC Urine RBCs = TNTC  ____________________________________________  EKG  Not indicated ____________________________________________  RADIOLOGY  Ct Renal Stone Study  12/24/2014   CLINICAL DATA:  Dysuria, hematuria, increased urinary frequency. Burning when voiding. Began at 2 a.m. this morning.  EXAM: CT ABDOMEN AND PELVIS WITHOUT CONTRAST  TECHNIQUE: Multidetector CT imaging of the abdomen and pelvis was performed following the standard protocol without IV contrast.  COMPARISON:  None.  FINDINGS: The lung bases are clear.  Kidneys are symmetrical in size and shape. No hydronephrosis or hydroureter. No renal, ureteral, or bladder stones. Bladder is decompressed.  The unenhanced appearance of the liver, spleen, gallbladder, pancreas, adrenal glands, abdominal aorta, inferior vena cava, and retroperitoneal lymph nodes is unremarkable. Stomach, small bowel, and colon are not abnormally distended. No free air or free fluid in the abdomen.  Pelvis: Uterus and ovaries are not enlarged. Small amount of free fluid in the pelvis is likely physiologic. No loculated fluid collections. No pelvic  mass or lymphadenopathy. Appendix is normal. No destructive bone lesions.  IMPRESSION: No renal, ureteral, or bladder stones identified. Bladder is decompressed, limiting evaluation for wall thickening. Physiologic free fluid in the pelvis.   Electronically Signed   By: Burman Nieves M.D.   On: 12/24/2014 05:26    ____________________________________________   PROCEDURES  Procedure(s) performed: None  Critical Care performed: No ____________________________________________   INITIAL IMPRESSION / ASSESSMENT AND PLAN / ED COURSE  Pertinent labs & imaging results that were available during my care of the patient were reviewed by me and considered in my medical decision making (see chart for details).  Given the gross hematuria in the acute worsening pain, I am concerned that this may represent a new presentation of kidney stones.  Missing a kidney stone in the setting of a UTI (as demonstrated by too  numerous to count white blood cells) could be very dangerous.  As a result I will evaluate with a CT renal protocol to rule out a kidney stone as well as to give an evaluation of her kidneys for possible pyelonephritis.  Her vital signs are normal and she appears stable at this time.  I will initiate treatment with ceftriaxone 1 g IM and Toradol 60 mg IM.  The patient took some of her own OxyContin at home which she takes for chronic pain so I will not re-dose with narcotics.  ----------------------------------------- 5:31 AM on 12/24/2014 -----------------------------------------  The CT scan was unremarkable.  Given her clear symptoms of recent dysuria and polyuria and a pelvic exam with negative GC/Chlamydia about 3 weeks ago, I do not believe there is an indication to repeat an exam tonight. I will treat the patient for urinary tract infection as planned and I gave her my usual and customary return precautions.   ____________________________________________  FINAL CLINICAL IMPRESSION(S) /  ED DIAGNOSES  Final diagnoses:  Suprapubic pain, acute  Hematuria  UTI (lower urinary tract infection)      NEW MEDICATIONS STARTED DURING THIS VISIT:  New Prescriptions   CEPHALEXIN (KEFLEX) 500 MG CAPSULE    Take 1 capsule (500 mg total) by mouth 3 (three) times daily.   PHENAZOPYRIDINE (PYRIDIUM) 95 MG TABLET    Take 2 tablets (190 mg) by mouth three times daily with meals     Loleta Rose, MD 12/24/14 [id number]

## 2014-12-24 NOTE — ED Notes (Signed)
Patient transported to CT 

## 2014-12-26 LAB — URINE CULTURE: SPECIAL REQUESTS: NORMAL

## 2014-12-29 ENCOUNTER — Encounter: Payer: Self-pay | Admitting: Emergency Medicine

## 2014-12-29 ENCOUNTER — Emergency Department
Admission: EM | Admit: 2014-12-29 | Discharge: 2014-12-29 | Disposition: A | Discharge status: Home / Self Care | Payer: Self-pay | Attending: Emergency Medicine | Admitting: Emergency Medicine

## 2014-12-29 DIAGNOSIS — B373 Candidiasis of vulva and vagina: Secondary | ICD-10-CM | POA: Insufficient documentation

## 2014-12-29 DIAGNOSIS — I10 Essential (primary) hypertension: Secondary | ICD-10-CM | POA: Insufficient documentation

## 2014-12-29 DIAGNOSIS — J029 Acute pharyngitis, unspecified: Secondary | ICD-10-CM | POA: Insufficient documentation

## 2014-12-29 DIAGNOSIS — B3731 Acute candidiasis of vulva and vagina: Secondary | ICD-10-CM

## 2014-12-29 DIAGNOSIS — Z87891 Personal history of nicotine dependence: Secondary | ICD-10-CM | POA: Insufficient documentation

## 2014-12-29 DIAGNOSIS — Z792 Long term (current) use of antibiotics: Secondary | ICD-10-CM | POA: Insufficient documentation

## 2014-12-29 DIAGNOSIS — Z79899 Other long term (current) drug therapy: Secondary | ICD-10-CM | POA: Insufficient documentation

## 2014-12-29 DIAGNOSIS — K2970 Gastritis, unspecified, without bleeding: Secondary | ICD-10-CM | POA: Insufficient documentation

## 2014-12-29 LAB — CBC
HCT: 39.4 % (ref 35.0–47.0)
HEMOGLOBIN: 13.4 g/dL (ref 12.0–16.0)
MCH: 30.2 pg (ref 26.0–34.0)
MCHC: 34 g/dL (ref 32.0–36.0)
MCV: 88.8 fL (ref 80.0–100.0)
Platelets: 302 10*3/uL (ref 150–440)
RBC: 4.43 MIL/uL (ref 3.80–5.20)
RDW: 12.3 % (ref 11.5–14.5)
WBC: 5.6 10*3/uL (ref 3.6–11.0)

## 2014-12-29 LAB — COMPREHENSIVE METABOLIC PANEL
ALBUMIN: 4.2 g/dL (ref 3.5–5.0)
ALT: 17 U/L (ref 14–54)
ANION GAP: 8 (ref 5–15)
AST: 20 U/L (ref 15–41)
Alkaline Phosphatase: 72 U/L (ref 38–126)
BUN: 9 mg/dL (ref 6–20)
CO2: 25 mmol/L (ref 22–32)
Calcium: 8.8 mg/dL — ABNORMAL LOW (ref 8.9–10.3)
Chloride: 103 mmol/L (ref 101–111)
Creatinine, Ser: 0.78 mg/dL (ref 0.44–1.00)
GFR calc Af Amer: >60 mL/min (ref >60)
GFR calc non Af Amer: >60 mL/min (ref >60)
GLUCOSE: 94 mg/dL (ref 65–99)
Potassium: 3.7 mmol/L (ref 3.5–5.1)
Sodium: 136 mmol/L (ref 135–145)
TOTAL PROTEIN: 7.7 g/dL (ref 6.5–8.1)
Total Bilirubin: 0.5 mg/dL (ref 0.3–1.2)

## 2014-12-29 LAB — URINALYSIS COMPLETE WITH MICROSCOPIC (ARMC ONLY)
BILIRUBIN URINE: NEGATIVE
GLUCOSE, UA: NEGATIVE mg/dL
Hgb urine dipstick: NEGATIVE
KETONES UR: NEGATIVE mg/dL
Nitrite: POSITIVE — AB
Protein, ur: NEGATIVE mg/dL
Specific Gravity, Urine: 1.015 (ref 1.005–1.030)
pH: 6 (ref 5.0–8.0)

## 2014-12-29 LAB — LIPASE, BLOOD: Lipase: 17 U/L — ABNORMAL LOW (ref 22–51)

## 2014-12-29 MED ORDER — MONISTAT 7 COMPLETE THERAPY 100-2 MG-% VA KIT: 1.0000 "application " | PACK | Freq: Every day | VAGINAL | Status: AC

## 2014-12-29 MED ORDER — GI COCKTAIL ~~LOC~~
30.0000 mL | Freq: Once | ORAL | Status: AC
  Administered 2014-12-29: 30 mL via ORAL
  Filled 2014-12-29: qty 30

## 2014-12-29 MED ORDER — FAMOTIDINE 20 MG PO TABS
40.0000 mg | ORAL_TABLET | Freq: Once | ORAL | Status: AC
  Administered 2014-12-29: 40 mg via ORAL
  Filled 2014-12-29: qty 2

## 2014-12-29 MED ORDER — SUCRALFATE 1 G PO TABS: 1.0000 g | ORAL_TABLET | Freq: Four times a day (QID) | ORAL | Status: DC

## 2014-12-29 MED ORDER — RANITIDINE HCL 150 MG PO CAPS: 150.0000 mg | ORAL_CAPSULE | Freq: Two times a day (BID) | ORAL | Status: DC

## 2014-12-29 NOTE — ED Provider Notes (Signed)
[facility] Emergency Department Provider Note  ____________________________________________  Time seen: 3:20 PM  I have reviewed the triage vital signs and the nursing notes.   HISTORY  Chief Complaint Abdominal Pain    HPI Roberta Hicks is a 39 y.o. female who complains of a cramping pain in her left upper quadrant of the abdomen for the last few days. She has been on amoxicillin preceding a dental procedure for about a week, and she started Keflex for urinary tract infection about 5 days ago. Notably, review of the records also indicate that she has recently been on ibuprofen and prednisone. Additionally, in preparation for the dental procedure, she has been on a liquid diet and decrease her oral intake especially of solids.  Denies any fever chills vomiting diarrhea chest pain shortness of breath. No syncope or dizziness. Apart from the left upper quadrant cramping which also is sometimes followed by a burning pain, she is also experiencing some vaginal discharge and itching since starting the antibiotics. No bleeding. No lower abdominal pain.  Left upper quadrant pain is nonradiating, no other aggravating or alleviating factors or associated symptoms. Moderate intensity.     Past Medical History  Diagnosis Date  . Hypertension     There are no active problems to display for this patient.   Past Surgical History  Procedure Laterality Date  . Tubal ligation      Current Outpatient Rx  Name  Route  Sig  Dispense  Refill  . cephALEXin (KEFLEX) 500 MG capsule   Oral   Take 1 capsule (500 mg total) by mouth 3 (three) times daily.   36 capsule   0   . cyclobenzaprine (FLEXERIL) 10 MG tablet   Oral   Take 1 tablet (10 mg total) by mouth every 8 (eight) hours as needed for muscle spasms.   30 tablet   1   . ibuprofen (ADVIL,MOTRIN) 800 MG tablet   Oral   Take 1 tablet (800 mg total) by mouth every 8 (eight) hours as needed.   30 tablet    0   . Miconazole Nitrate-Wipes (MONISTAT 7 COMPLETE THERAPY) 100-2 MG-% KIT   Vaginal   Place 1 application vaginally at bedtime.   1 kit   0   . phenazopyridine (PYRIDIUM) 95 MG tablet      Take 2 tablets (190 mg) by mouth three times daily with meals   12 tablet   0   . predniSONE (DELTASONE) 10 MG tablet      Take 5 tablets daily for 5 days.   25 tablet   0   . ranitidine (ZANTAC) 150 MG capsule   Oral   Take 1 capsule (150 mg total) by mouth 2 (two) times daily.   28 capsule   0   . sucralfate (CARAFATE) 1 G tablet   Oral   Take 1 tablet (1 g total) by mouth 4 (four) times daily.   120 tablet   1   . traMADol (ULTRAM) 50 MG tablet   Oral   Take 1 tablet (50 mg total) by mouth every 6 (six) hours as needed.   10 tablet   0     Allergies Review of patient's allergies indicates no known allergies.  History reviewed. No pertinent family history.  Social History History  Substance Use Topics  . Smoking status: Former Smoker -- 0.10 packs/day    Types: Cigarettes  . Smokeless tobacco: Never Used  . Alcohol Use: 0.6 oz/week  1 Standard drinks or equivalent per week    Review of Systems  Constitutional: No fever or chills. No weight changes Eyes:No blurry vision or double vision.  ENT: Mild sore throat. Cardiovascular: No chest pain. Respiratory: No dyspnea or cough. Gastrointestinal: Abdominal pain as above.  No BRBPR or melena. Genitourinary: Vaginal discharge and itching as above Musculoskeletal: Negative for back pain. No joint swelling or pain. Skin: Negative for rash. Neurological: Negative for headaches, focal weakness or numbness. Psychiatric:No anxiety or depression.   Endocrine:No hot/cold intolerance, changes in energy, or sleep difficulty.  10-point ROS otherwise negative.  ____________________________________________   PHYSICAL EXAM:  VITAL SIGNS: ED Triage Vitals  Enc Vitals Group     BP 12/29/14 1245 135/98 mmHg     Pulse  Rate 12/29/14 1245 96     Resp --      Temp 12/29/14 1245 98.8 F (37.1 C)     Temp Source 12/29/14 1245 Oral     SpO2 12/29/14 1245 99 %     Weight 12/29/14 1245 180 lb (81.647 kg)     Height 12/29/14 1245 5' 7"  (1.702 m)     Head Cir --      Peak Flow --      Pain Score 12/29/14 1248 5     Pain Loc --      Pain Edu? --      Excl. in [place]? --      Constitutional: Alert and oriented. Well appearing and in no distress. Eyes: No scleral icterus. No conjunctival pallor. PERRL. EOMI ENT   Head: Normocephalic and atraumatic.   Nose: No congestion/rhinnorhea. No septal hematoma   Mouth/Throat: MMM, mild pharyngeal erythema around the tonsillar pillars. No peritonsillar mass. No uvula shift.   Neck: No stridor. No SubQ emphysema. No meningismus. Hematological/Lymphatic/Immunilogical: No cervical lymphadenopathy. Cardiovascular: RRR. Normal and symmetric distal pulses are present in all extremities. No murmurs, rubs, or gallops. Respiratory: Normal respiratory effort without tachypnea nor retractions. Breath sounds are clear and equal bilaterally. No wheezes/rales/rhonchi. Gastrointestinal: Soft and nontender. No distention. There is no CVA tenderness.  No rebound, rigidity, or guarding. Genitourinary: deferred Musculoskeletal: Nontender with normal range of motion in all extremities. No joint effusions.  No lower extremity tenderness.  No edema. Neurologic:   Normal speech and language.  CN 2-10 normal. Motor grossly intact. No pronator drift.  Normal gait. No gross focal neurologic deficits are appreciated.  Skin:  Skin is warm, dry and intact. No rash noted.  No petechiae, purpura, or bullae. Psychiatric: Mood and affect are normal. Speech and behavior are normal. Patient exhibits appropriate insight and judgment.  ____________________________________________    LABS (pertinent positives/negatives) (all labs ordered are listed, but only abnormal results are  displayed) Labs Reviewed  LIPASE, BLOOD - Abnormal; Notable for the following:    Lipase 17 (*)    All other components within normal limits  COMPREHENSIVE METABOLIC PANEL - Abnormal; Notable for the following:    Calcium 8.8 (*)    All other components within normal limits  URINALYSIS COMPLETEWITH MICROSCOPIC ([facility] ONLY) - Abnormal; Notable for the following:    Color, Urine AMBER (*)    APPearance CLEAR (*)    Nitrite POSITIVE (*)    Leukocytes, UA 2+ (*)    Bacteria, UA RARE (*)    Squamous Epithelial / LPF 0-5 (*)    All other components within normal limits  CBC   ____________________________________________   EKG    ____________________________________________    RADIOLOGY  ____________________________________________   PROCEDURES  ____________________________________________   INITIAL IMPRESSION / ASSESSMENT AND PLAN / ED COURSE  Pertinent labs & imaging results that were available during my care of the patient were reviewed by me and considered in my medical decision making (see chart for details).  Patient presents with symptoms and clinical scenario consistent with gastritis due to decreased oral intake, prednisone and NSAID use concurrently. She feels better after receiving a GI cocktail in the ED with resolution of her symptoms. We will continue her on antacids for the remainder of her antibiotic course. She also appears to be having a candidal yeast infection of the vagina related to her antibiotic use. We will start her on miconazole therapy empirically. Suspicion for TOA PID STI torsion. No evidence of other surgical pathology in the abdomen. Patient very well appearing nontoxic no acute distress, tolerating oral intake, we'll discharge home.  ____________________________________________   FINAL CLINICAL IMPRESSION(S) / ED DIAGNOSES  Final diagnoses:  Gastritis  Candida vaginitis      Carrie Mew, MD 12/29/14 1542

## 2014-12-29 NOTE — ED Notes (Signed)
Pt alert and oriented X4, active, cooperative, pt in NAD. RR even and unlabored, color WNL.  Pt informed to return if any life threatening symptoms occur.   

## 2014-12-29 NOTE — Discharge Instructions (Signed)
Candidal Vulvovaginitis Candidal vulvovaginitis is an infection of the vagina and vulva. The vulva is the skin around the opening of the vagina. This may cause itching and discomfort in and around the vagina.  HOME CARE  Only take medicine as told by your doctor.  Do not have sex (intercourse) until the infection is healed or as told by your doctor.  Practice safe sex.  Tell your sex partner about your infection.  Do not douche or use tampons.  Wear cotton underwear. Do not wear tight pants or panty hose.  Eat yogurt. This may help treat and prevent yeast infections. GET HELP RIGHT AWAY IF:   You have a fever.  Your problems get worse during treatment or do not get better in 3 days.  You have discomfort, irritation, or itching in your vagina or vulva area.  You have pain after sex.  You start to get belly (abdominal) pain. MAKE SURE YOU:  Understand these instructions.  Will watch your condition.  Will get help right away if you are not doing well or get worse. Document Released: 08/27/2008 Document Revised: 06/05/2013 Document Reviewed: 08/27/2008 [facility] Patient Information 2015 [place]. This information is not intended to replace advice given to you by your health care provider. Make sure you discuss any questions you have with your health care provider.  Gastritis, Adult Gastritis is soreness and swelling (inflammation) of the lining of the stomach. Gastritis can develop as a sudden onset (acute) or long-term (chronic) condition. If gastritis is not treated, it can lead to stomach bleeding and ulcers. CAUSES  Gastritis occurs when the stomach lining is weak or damaged. Digestive juices from the stomach then inflame the weakened stomach lining. The stomach lining may be weak or damaged due to viral or bacterial infections. One common bacterial infection is the Helicobacter pylori infection. Gastritis can also result from excessive alcohol consumption, taking  certain medicines, or having too much acid in the stomach.  SYMPTOMS  In some cases, there are no symptoms. When symptoms are present, they may include:  Pain or a burning sensation in the upper abdomen.  Nausea.  Vomiting.  An uncomfortable feeling of fullness after eating. DIAGNOSIS  Your caregiver may suspect you have gastritis based on your symptoms and a physical exam. To determine the cause of your gastritis, your caregiver may perform the following:  Blood or stool tests to check for the H pylori bacterium.  Gastroscopy. A thin, flexible tube (endoscope) is passed down the esophagus and into the stomach. The endoscope has a light and camera on the end. Your caregiver uses the endoscope to view the inside of the stomach.  Taking a tissue sample (biopsy) from the stomach to examine under a microscope. TREATMENT  Depending on the cause of your gastritis, medicines may be prescribed. If you have a bacterial infection, such as an H pylori infection, antibiotics may be given. If your gastritis is caused by too much acid in the stomach, H2 blockers or antacids may be given. Your caregiver may recommend that you stop taking aspirin, ibuprofen, or other nonsteroidal anti-inflammatory drugs (NSAIDs). HOME CARE INSTRUCTIONS  Only take over-the-counter or prescription medicines as directed by your caregiver.  If you were given antibiotic medicines, take them as directed. Finish them even if you start to feel better.  Drink enough fluids to keep your urine clear or pale yellow.  Avoid foods and drinks that make your symptoms worse, such as:  Caffeine or alcoholic drinks.  Chocolate.  Peppermint  or mint flavorings.  Garlic and onions.  Spicy foods.  Citrus fruits, such as oranges, lemons, or limes.  Tomato-based foods such as sauce, chili, salsa, and pizza.  Fried and fatty foods.  Eat small, frequent meals instead of large meals. SEEK IMMEDIATE MEDICAL CARE IF:   You have  black or dark red stools.  You vomit blood or material that looks like coffee grounds.  You are unable to keep fluids down.  Your abdominal pain gets worse.  You have a fever.  You do not feel better after 1 week.  You have any other questions or concerns. MAKE SURE YOU:  Understand these instructions.  Will watch your condition.  Will get help right away if you are not doing well or get worse. Document Released: 05/25/2001 Document Revised: 11/30/2011 Document Reviewed: 07/14/2011 [facility] Patient Information 2015 [place], [place]. This information is not intended to replace advice given to you by your health care provider. Make sure you discuss any questions you have with your health care provider.

## 2014-12-29 NOTE — ED Notes (Signed)
Pt reports that she has been taking antibiotics for UTI and that she has begun having "stomach spasms" since she began taking them. Pain in LUQ. She also c/o yeast infection.

## 2014-12-29 NOTE — ED Notes (Signed)
Pt was started on antibiotics for UTI and also amoxicillin for dental procedure "a few days ago". Since started medications, pt has experienced thick white/yellow vaginal discharge with itching. Pt also c/o LUQ "spasm" like pain for the past few days. Pt alert and oriented X4, active, cooperative, pt in NAD. RR even and unlabored, color WNL.

## 2015-02-18 ENCOUNTER — Encounter: Payer: Self-pay | Admitting: Emergency Medicine

## 2015-02-18 ENCOUNTER — Emergency Department
Admission: EM | Admit: 2015-02-18 | Discharge: 2015-02-18 | Disposition: A | Discharge status: Home / Self Care | Payer: BLUE CROSS/BLUE SHIELD | Attending: Emergency Medicine | Admitting: Emergency Medicine

## 2015-02-18 DIAGNOSIS — N898 Other specified noninflammatory disorders of vagina: Secondary | ICD-10-CM | POA: Diagnosis not present

## 2015-02-18 DIAGNOSIS — N3091 Cystitis, unspecified with hematuria: Secondary | ICD-10-CM | POA: Insufficient documentation

## 2015-02-18 DIAGNOSIS — Z87891 Personal history of nicotine dependence: Secondary | ICD-10-CM | POA: Insufficient documentation

## 2015-02-18 DIAGNOSIS — I10 Essential (primary) hypertension: Secondary | ICD-10-CM | POA: Insufficient documentation

## 2015-02-18 DIAGNOSIS — R319 Hematuria, unspecified: Secondary | ICD-10-CM | POA: Diagnosis present

## 2015-02-18 DIAGNOSIS — N3090 Cystitis, unspecified without hematuria: Secondary | ICD-10-CM

## 2015-02-18 LAB — WET PREP, GENITAL
Clue Cells Wet Prep HPF POC: NONE SEEN
TRICH WET PREP: NONE SEEN
WBC, Wet Prep HPF POC: NONE SEEN
Yeast Wet Prep HPF POC: NONE SEEN

## 2015-02-18 LAB — URINALYSIS COMPLETE WITH MICROSCOPIC (ARMC ONLY)
Bacteria, UA: NONE SEEN
Specific Gravity, Urine: 1.025 (ref 1.005–1.030)

## 2015-02-18 LAB — CHLAMYDIA/NGC RT PCR (ARMC ONLY)
Chlamydia Tr: NOT DETECTED
N gonorrhoeae: NOT DETECTED

## 2015-02-18 MED ORDER — SULFAMETHOXAZOLE-TRIMETHOPRIM 800-160 MG PO TABS: 1.0000 | ORAL_TABLET | Freq: Two times a day (BID) | ORAL | Status: DC

## 2015-02-18 MED ORDER — PHENAZOPYRIDINE HCL 200 MG PO TABS: 200.0000 mg | ORAL_TABLET | Freq: Three times a day (TID) | ORAL | Status: AC | PRN

## 2015-02-18 MED ORDER — AZITHROMYCIN 250 MG PO TABS
1000.0000 mg | ORAL_TABLET | Freq: Once | ORAL | Status: AC
  Administered 2015-02-18: 1000 mg via ORAL
  Filled 2015-02-18: qty 4

## 2015-02-18 MED ORDER — CEFTRIAXONE SODIUM 250 MG IJ SOLR
250.0000 mg | INTRAMUSCULAR | Status: DC
  Administered 2015-02-18: 250 mg via INTRAMUSCULAR
  Filled 2015-02-18: qty 250

## 2015-02-18 MED ORDER — PHENAZOPYRIDINE HCL 200 MG PO TABS
200.0000 mg | ORAL_TABLET | Freq: Once | ORAL | Status: AC
  Administered 2015-02-18: 200 mg via ORAL
  Filled 2015-02-18: qty 1

## 2015-02-18 MED ORDER — LIDOCAINE HCL (PF) 1 % IJ SOLN
INTRAMUSCULAR | Status: AC
  Administered 2015-02-18: 0.9 mL
  Filled 2015-02-18: qty 5

## 2015-02-18 MED ORDER — SULFAMETHOXAZOLE-TRIMETHOPRIM 800-160 MG PO TABS
1.0000 | ORAL_TABLET | Freq: Once | ORAL | Status: AC
  Administered 2015-02-18: 1 via ORAL
  Filled 2015-02-18: qty 1

## 2015-02-18 NOTE — ED Provider Notes (Signed)
[facility] Emergency Department Provider Note     Time seen: ----------------------------------------- 12:43 PM on 02/18/2015 -----------------------------------------    I have reviewed the triage vital signs and the nursing notes.   HISTORY  Chief Complaint Hematuria    HPI Roberta Hicks is a 39 y.o. female who presents ER for painful urination along with some blood in her urine started yesterday. Patient states she thinks she has UTI, she's not had any vaginal bleeding or discharge. She denies fevers chills or other complaints. Was seen 2 months ago for same which didn't completely resolve the issue.   Past Medical History  Diagnosis Date  . Hypertension     There are no active problems to display for this patient.   Past Surgical History  Procedure Laterality Date  . Tubal ligation      Allergies Review of patient's allergies indicates no known allergies.  Social History Social History  Substance Use Topics  . Smoking status: Former Smoker -- 0.10 packs/day    Types: Cigarettes  . Smokeless tobacco: Never Used  . Alcohol Use: 0.6 oz/week    1 Standard drinks or equivalent per week    Review of Systems Constitutional: Negative for fever. Eyes: Negative for visual changes. ENT: Negative for sore throat. Cardiovascular: Negative for chest pain. Respiratory: Negative for shortness of breath. Gastrointestinal: Negative for abdominal pain, vomiting and diarrhea. Genitourinary: Positive for dysuria Musculoskeletal: Negative for back pain. Skin: Negative for rash. Neurological: Negative for headaches, focal weakness or numbness.  10-point ROS otherwise negative.  ____________________________________________   PHYSICAL EXAM:  VITAL SIGNS: ED Triage Vitals  Enc Vitals Group     BP 02/18/15 1114 139/96 mmHg     Pulse Rate 02/18/15 1114 91     Resp 02/18/15 1114 20     Temp 02/18/15 1114 98 F (36.7 C)     Temp Source  02/18/15 1114 Oral     SpO2 02/18/15 1114 100 %     Weight 02/18/15 1114 180 lb (81.647 kg)     Height 02/18/15 1114  (1.702 m)     Head Cir --      Peak Flow --      Pain Score 02/18/15 1115 10     Pain Loc --      Pain Edu? --      Excl. in GC? --     Constitutional: Alert and oriented. Well appearing and in no distress. Eyes: Conjunctivae are normal. PERRL. Normal extraocular movements. ENT   Head: Normocephalic and atraumatic.   Nose: No congestion/rhinnorhea.   Mouth/Throat: Mucous membranes are moist.   Neck: No stridor. Cardiovascular: Normal rate, regular rhythm. Normal and symmetric distal pulses are present in all extremities. No murmurs, rubs, or gallops. Respiratory: Normal respiratory effort without tachypnea nor retractions. Breath sounds are clear and equal bilaterally. No wheezes/rales/rhonchi. Gastrointestinal: Soft and nontender. No distention. No abdominal bruits.  Genitourinary: There is significant white vaginal discharge, cervical motion tenderness. Musculoskeletal: Nontender with normal range of motion in all extremities. No joint effusions.  No lower extremity tenderness nor edema. Neurologic:  Normal speech and language. No gross focal neurologic deficits are appreciated. Speech is normal. No gait instability. Skin:  Skin is warm, dry and intact. No rash noted. Psychiatric: Mood and affect are normal. Speech and behavior are normal. Patient exhibits appropriate insight and judgment.  ED COURSE:  Pertinent labs & imaging results that were available during my care of the patient were reviewed by me  and considered in my medical decision making (see chart for details). We'll check urine and pelvic labs ____________________________________________    LABS (pertinent positives/negatives)  Labs Reviewed  URINALYSIS COMPLETEWITH MICROSCOPIC ([facility] ONLY) - Abnormal; Notable for the following:    Color, Urine RED (*)    APPearance TURBID (*)     Glucose, UA   (*)    Value: TEST NOT REPORTED DUE TO COLOR INTERFERENCE OF URINE PIGMENT   Bilirubin Urine   (*)    Value: TEST NOT REPORTED DUE TO COLOR INTERFERENCE OF URINE PIGMENT   Ketones, ur   (*)    Value: TEST NOT REPORTED DUE TO COLOR INTERFERENCE OF URINE PIGMENT   Hgb urine dipstick   (*)    Value: TEST NOT REPORTED DUE TO COLOR INTERFERENCE OF URINE PIGMENT   Protein, ur   (*)    Value: TEST NOT REPORTED DUE TO COLOR INTERFERENCE OF URINE PIGMENT   Nitrite   (*)    Value: TEST NOT REPORTED DUE TO COLOR INTERFERENCE OF URINE PIGMENT   Leukocytes, UA   (*)    Value: TEST NOT REPORTED DUE TO COLOR INTERFERENCE OF URINE PIGMENT   Squamous Epithelial / LPF 6-30 (*)    All other components within normal limits  WET PREP, GENITAL  CHLAMYDIA/NGC RT PCR ([facility] ONLY)    ____________________________________________  FINAL ASSESSMENT AND PLAN  Cystitis, vaginal discharge  Plan: Patient with labs and imaging as dictated above. Patient was given Rocephin and Zithromax to cover for STD. She'll be discharged with Septra and Pyridium. She is encouraged to follow-up in 2 days if not improving   Emily Filbert, MD   Emily Filbert, MD 02/18/15 [phone]

## 2015-02-18 NOTE — ED Notes (Signed)
Patient to ED with report of painful urination along with noticing some blood that started yesterday. States "I think I have a UTI".

## 2015-02-18 NOTE — Discharge Instructions (Signed)
Urinary Tract Infection °Urinary tract infections (UTIs) can develop anywhere along your urinary tract. Your urinary tract is your body's drainage system for removing wastes and extra water. Your urinary tract includes two kidneys, two ureters, a bladder, and a urethra. Your kidneys are a pair of bean-shaped organs. Each kidney is about the size of your fist. They are located below your ribs, one on each side of your spine. °CAUSES °Infections are caused by microbes, which are microscopic organisms, including fungi, viruses, and bacteria. These organisms are so small that they can only be seen through a microscope. Bacteria are the microbes that most commonly cause UTIs. °SYMPTOMS  °Symptoms of UTIs may vary by age and gender of the patient and by the location of the infection. Symptoms in young women typically include a frequent and intense urge to urinate and a painful, burning feeling in the bladder or urethra during urination. Older women and men are more likely to be tired, shaky, and weak and have muscle aches and abdominal pain. A fever may mean the infection is in your kidneys. Other symptoms of a kidney infection include pain in your back or sides below the ribs, nausea, and vomiting. °DIAGNOSIS °To diagnose a UTI, your caregiver will ask you about your symptoms. Your caregiver also will ask to provide a urine sample. The urine sample will be tested for bacteria and white blood cells. White blood cells are made by your body to help fight infection. °TREATMENT  °Typically, UTIs can be treated with medication. Because most UTIs are caused by a bacterial infection, they usually can be treated with the use of antibiotics. The choice of antibiotic and length of treatment depend on your symptoms and the type of bacteria causing your infection. °HOME CARE INSTRUCTIONS °· If you were prescribed antibiotics, take them exactly as your caregiver instructs you. Finish the medication even if you feel better after you  have only taken some of the medication. °· Drink enough water and fluids to keep your urine clear or pale yellow. °· Avoid caffeine, tea, and carbonated beverages. They tend to irritate your bladder. °· Empty your bladder often. Avoid holding urine for long periods of time. °· Empty your bladder before and after sexual intercourse. °· After a bowel movement, women should cleanse from front to back. Use each tissue only once. °SEEK MEDICAL CARE IF:  °· You have back pain. °· You develop a fever. °· Your symptoms do not begin to resolve within 3 days. °SEEK IMMEDIATE MEDICAL CARE IF:  °· You have severe back pain or lower abdominal pain. °· You develop chills. °· You have nausea or vomiting. °· You have continued burning or discomfort with urination. °MAKE SURE YOU:  °· Understand these instructions. °· Will watch your condition. °· Will get help right away if you are not doing well or get worse. °Document Released: 03/10/2005 Document Revised: 11/30/2011 Document Reviewed: 07/09/2011 °ExitCare® Patient Information ©2015 [employer]. This information is not intended to replace advice given to you by your health care provider. Make sure you discuss any questions you have with your health care provider. ° °Pelvic Pain °Female pelvic pain can be caused by many different things and start from a variety of places. Pelvic pain refers to pain that is located in the lower half of the abdomen and between your hips. The pain may occur over a short period of time (acute) or may be reoccurring (chronic). The cause of pelvic pain may be related to disorders affecting the   female reproductive organs (gynecologic), but it may also be related to the bladder, kidney stones, an intestinal complication, or muscle or skeletal problems. Getting help right away for pelvic pain is important, especially if there has been severe, sharp, or a sudden onset of unusual pain. It is also important to get help right away because some types of  pelvic pain can be life threatening.  °CAUSES  °Below are only some of the causes of pelvic pain. The causes of pelvic pain can be in one of several categories.  °· Gynecologic. °¨ Pelvic inflammatory disease. °¨ Sexually transmitted infection. °¨ Ovarian cyst or a twisted ovarian ligament (ovarian torsion). °¨ Uterine lining that grows outside the uterus (endometriosis). °¨ Fibroids, cysts, or tumors. °¨ Ovulation. °· Pregnancy. °¨ Pregnancy that occurs outside the uterus (ectopic pregnancy). °¨ Miscarriage. °¨ Labor. °¨ Abruption of the placenta or ruptured uterus. °· Infection. °¨ Uterine infection (endometritis). °¨ Bladder infection. °¨ Diverticulitis. °¨ Miscarriage related to a uterine infection (septic abortion). °· Bladder. °¨ Inflammation of the bladder (cystitis). °¨ Kidney stone(s). °· Gastrointestinal. °¨ Constipation. °¨ Diverticulitis. °· Neurologic. °¨ Trauma. °¨ Feeling pelvic pain because of mental or emotional causes (psychosomatic). °· Cancers of the bowel or pelvis. °EVALUATION  °Your caregiver will want to take a careful history of your concerns. This includes recent changes in your health, a careful gynecologic history of your periods (menses), and a sexual history. Obtaining your family history and medical history is also important. Your caregiver may suggest a pelvic exam. A pelvic exam will help identify the location and severity of the pain. It also helps in the evaluation of which organ system may be involved. In order to identify the cause of the pelvic pain and be properly treated, your caregiver may order tests. These tests may include:  °· A pregnancy test. °· Pelvic ultrasonography. °· An X-ray exam of the abdomen. °· A urinalysis or evaluation of vaginal discharge. °· Blood tests. °HOME CARE INSTRUCTIONS  °· Only take over-the-counter or prescription medicines for pain, discomfort, or fever as directed by your caregiver.   °· Rest as directed by your caregiver.   °· Eat a balanced  diet.   °· Drink enough fluids to make your urine clear or pale yellow, or as directed.   °· Avoid sexual intercourse if it causes pain.   °· Apply warm or cold compresses to the lower abdomen depending on which one helps the pain.   °· Avoid stressful situations.   °· Keep a journal of your pelvic pain. Write down when it started, where the pain is located, and if there are things that seem to be associated with the pain, such as food or your menstrual cycle. °· Follow up with your caregiver as directed.   °SEEK MEDICAL CARE IF: °· Your medicine does not help your pain. °· You have abnormal vaginal discharge. °SEEK IMMEDIATE MEDICAL CARE IF:  °· You have heavy bleeding from the vagina.   °· Your pelvic pain increases.   °· You feel light-headed or faint.   °· You have chills.   °· You have pain with urination or blood in your urine.   °· You have uncontrolled diarrhea or vomiting.   °· You have a fever or persistent symptoms for more than 3 days. °· You have a fever and your symptoms suddenly get worse.   °· You are being physically or sexually abused.   °MAKE SURE YOU: °· Understand these instructions. °· Will watch your condition. °· Will get help if you are not doing well or get worse. °Document Released: 04/27/2004   Document Revised: 10/15/2013 Document Reviewed: 09/20/2011 °ExitCare® Patient Information ©2015 [employer]. This information is not intended to replace advice given to you by your health care provider. Make sure you discuss any questions you have with your health care provider. ° °

## 2015-02-20 ENCOUNTER — Emergency Department
Admission: EM | Admit: 2015-02-20 | Discharge: 2015-02-20 | Disposition: A | Discharge status: Home / Self Care | Payer: BLUE CROSS/BLUE SHIELD | Attending: Emergency Medicine | Admitting: Emergency Medicine

## 2015-02-20 ENCOUNTER — Encounter: Payer: Self-pay | Admitting: Emergency Medicine

## 2015-02-20 DIAGNOSIS — Z79899 Other long term (current) drug therapy: Secondary | ICD-10-CM | POA: Diagnosis not present

## 2015-02-20 DIAGNOSIS — J029 Acute pharyngitis, unspecified: Secondary | ICD-10-CM | POA: Diagnosis not present

## 2015-02-20 DIAGNOSIS — L209 Atopic dermatitis, unspecified: Secondary | ICD-10-CM | POA: Diagnosis not present

## 2015-02-20 DIAGNOSIS — Z87891 Personal history of nicotine dependence: Secondary | ICD-10-CM | POA: Diagnosis not present

## 2015-02-20 DIAGNOSIS — I10 Essential (primary) hypertension: Secondary | ICD-10-CM | POA: Insufficient documentation

## 2015-02-20 LAB — POCT RAPID STREP A: Streptococcus, Group A Screen (Direct): NEGATIVE

## 2015-02-20 MED ORDER — LIDOCAINE VISCOUS 2 % MT SOLN: 20.0000 mL | OROMUCOSAL | Status: DC | PRN

## 2015-02-20 MED ORDER — HYDROCORTISONE 0.5 % EX CREA: 1.0000 "application " | TOPICAL_CREAM | Freq: Two times a day (BID) | CUTANEOUS | Status: AC

## 2015-02-20 MED ORDER — CHLORPHENIRAMINE MALEATE 4 MG PO TABS: 4.0000 mg | ORAL_TABLET | Freq: Two times a day (BID) | ORAL | Status: DC | PRN

## 2015-02-20 NOTE — Discharge Instructions (Signed)
Allergies  Allergies may happen from anything your body is sensitive to. This may be food, medicines, pollens, chemicals, and many other things. Food allergies can be severe and deadly.  HOME CARE  If you do not know what causes a reaction, keep a diary. Write down the foods you ate and the symptoms that followed. Avoid foods that cause reactions.  If you have red raised spots (hives) or a rash:  Take medicine as told by your doctor.  Use medicines for red raised spots and itching as needed.  Apply cold cloths (compresses) to the skin. Take a cool bath. Avoid hot baths or showers.  If you are severely allergic:  It is often necessary to go to the hospital after you have treated your reaction.  Wear your medical alert jewelry.  You and your family must learn how to give a allergy shot or use an allergy kit (anaphylaxis kit).  Always carry your allergy kit or shot with you. Use this medicine as told by your doctor if a severe reaction is occurring. GET HELP RIGHT AWAY IF:  You have trouble breathing or are making high-pitched whistling sounds (wheezing).  You have a tight feeling in your chest or throat.  You have a puffy (swollen) mouth.  You have red raised spots, puffiness (swelling), or itching all over your body.  You have had a severe reaction that was helped by your allergy kit or shot. The reaction can return once the medicine has worn off.  You think you are having a food allergy. Symptoms most often happen within 30 minutes of eating a food.  Your symptoms have not gone away within 2 days or are getting worse.  You have new symptoms.  You want to retest yourself with a food or drink you think causes an allergic reaction. Only do this under the care of a doctor. MAKE SURE YOU:   Understand these instructions.  Will watch your condition.  Will get help right away if you are not doing well or get worse. Document Released: 09/25/2012 Document Reviewed:  09/25/2012 [facility] Patient Information 2015 [place]. This information is not intended to replace advice given to you by your health care provider. Make sure you discuss any questions you have with your health care provider.  Pharyngitis Pharyngitis is a sore throat (pharynx). There is redness, pain, and swelling of your throat. HOME CARE   Drink enough fluids to keep your pee (urine) clear or pale yellow.  Only take medicine as told by your doctor.  You may get sick again if you do not take medicine as told. Finish your medicines, even if you start to feel better.  Do not take aspirin.  Rest.  Rinse your mouth (gargle) with salt water ( tsp of salt per 1 qt of water) every 1-2 hours. This will help the pain.  If you are not at risk for choking, you can suck on hard candy or sore throat lozenges. GET HELP IF:  You have large, tender lumps on your neck.  You have a rash.  You cough up green, yellow-brown, or bloody spit. GET HELP RIGHT AWAY IF:   You have a stiff neck.  You drool or cannot swallow liquids.  You throw up (vomit) or are not able to keep medicine or liquids down.  You have very bad pain that does not go away with medicine.  You have problems breathing (not from a stuffy nose). MAKE SURE YOU:   Understand these instructions.  Will watch your condition.  Will get help right away if you are not doing well or get worse. Document Released: 11/17/2007 Document Revised: 03/21/2013 Document Reviewed: 02/05/2013 [facility] Patient Information 2015 [place], [place]. This information is not intended to replace advice given to you by your health care provider. Make sure you discuss any questions you have with your health care provider.

## 2015-02-20 NOTE — ED Notes (Signed)
Pt reports sore throat and rash on face.  Pt wonders if rash may be allergic reaction and sprayed for fleas in house yesterday.  Pt reports in past when she had strep throat she has had rash as well.  Pt reports taking bactrim for UTI.   Pt NAD at this time.

## 2015-02-20 NOTE — ED Provider Notes (Signed)
[facility] Emergency Department Provider Note  ____________________________________________  Time seen: Approximately 9:09 PM  I have reviewed the triage vital signs and the nursing notes.   HISTORY  Chief Complaint Sore Throat and Rash    HPI Roberta Hicks is a 39 y.o. female presents for evaluation of sore throat and rash on face. Patient does allergies allergic reaction for sprain for fleas yesterday patient states that she was seen for UTI given a shot of Rocephin and Zithromax yesterday. Denies any fever.Admits to drainage and sore throat, worse in the morning..   Past Medical History  Diagnosis Date  . Hypertension     There are no active problems to display for this patient.   Past Surgical History  Procedure Laterality Date  . Tubal ligation      Current Outpatient Rx  Name  Route  Sig  Dispense  Refill  . cephALEXin (KEFLEX) 500 MG capsule   Oral   Take 1 capsule (500 mg total) by mouth 3 (three) times daily.   36 capsule   0   . chlorpheniramine (CHLOR-TRIMETON) 4 MG tablet   Oral   Take 1 tablet (4 mg total) by mouth 2 (two) times daily as needed for allergies or rhinitis.   30 tablet   0   . cyclobenzaprine (FLEXERIL) 10 MG tablet   Oral   Take 1 tablet (10 mg total) by mouth every 8 (eight) hours as needed for muscle spasms.   30 tablet   1   . hydrocortisone cream 0.5 %   Topical   Apply 1 application topically 2 (two) times daily.   30 g   0   . ibuprofen (ADVIL,MOTRIN) 800 MG tablet   Oral   Take 1 tablet (800 mg total) by mouth every 8 (eight) hours as needed.   30 tablet   0   . lidocaine (XYLOCAINE) 2 % solution   Mouth/Throat   Use as directed 20 mLs in the mouth or throat as needed for mouth pain.   100 mL   0   . phenazopyridine (PYRIDIUM) 200 MG tablet   Oral   Take 1 tablet (200 mg total) by mouth 3 (three) times daily as needed for pain.   20 tablet   0   . phenazopyridine (PYRIDIUM) 95 MG  tablet      Take 2 tablets (190 mg) by mouth three times daily with meals   12 tablet   0   . predniSONE (DELTASONE) 10 MG tablet      Take 5 tablets daily for 5 days.   25 tablet   0   . ranitidine (ZANTAC) 150 MG capsule   Oral   Take 1 capsule (150 mg total) by mouth 2 (two) times daily.   28 capsule   0   . sucralfate (CARAFATE) 1 G tablet   Oral   Take 1 tablet (1 g total) by mouth 4 (four) times daily.   120 tablet   1   . sulfamethoxazole-trimethoprim (BACTRIM DS) 800-160 MG per tablet   Oral   Take 1 tablet by mouth 2 (two) times daily.   20 tablet   0   . traMADol (ULTRAM) 50 MG tablet   Oral   Take 1 tablet (50 mg total) by mouth every 6 (six) hours as needed.   10 tablet   0     Allergies Review of patient's allergies indicates no known allergies.  No family history on file.  Social History Social History  Substance Use Topics  . Smoking status: Former Smoker -- 0.10 packs/day    Types: Cigarettes  . Smokeless tobacco: Never Used  . Alcohol Use: 0.6 oz/week    1 Standard drinks or equivalent per week    Review of Systems Constitutional: No fever/chills Eyes: No visual changes. ENT: Positive sore throat Cardiovascular: Denies chest pain. Respiratory: Denies shortness of breath. Gastrointestinal: No abdominal pain.  No nausea, no vomiting.  No diarrhea.  No constipation. Genitourinary: Negative for dysuria. Musculoskeletal: Negative for back pain. Skin: Positive for facial rash Neurological: Negative for headaches, focal weakness or numbness.  10-point ROS otherwise negative.  ____________________________________________   PHYSICAL EXAM:  VITAL SIGNS: ED Triage Vitals  Enc Vitals Group     BP 02/20/15 2040 138/91 mmHg     Pulse Rate 02/20/15 2040 91     Resp 02/20/15 2040 20     Temp 02/20/15 2040 98.6 F (37 C)     Temp Source 02/20/15 2040 Oral     SpO2 02/20/15 2040 96 %     Weight 02/20/15 2040 180 lb (81.647 kg)      Height 02/20/15 2040 5\' 7"  (1.702 m)     Head Cir --      Peak Flow --      Pain Score 02/20/15 2040 7     Pain Loc --      Pain Edu? --      Excl. in GC? --     Constitutional: Alert and oriented. Well appearing and in no acute distress. Eyes: Conjunctivae are normal. PERRL. EOMI. Head: Atraumatic. Nose: No congestion/rhinnorhea. Mouth/Throat: Mucous membranes are moist.  Oropharynx non-erythematous. Neck: No stridor.   Cardiovascular: Normal rate, regular rhythm. Grossly normal heart sounds.  Good peripheral circulation. Respiratory: Normal respiratory effort.  No retractions. Lungs CTAB. Gastrointestinal: Soft and nontender. No distention. No abdominal bruits. No CVA tenderness. Musculoskeletal: No lower extremity tenderness nor edema.  No joint effusions. Neurologic:  Normal speech and language. No gross focal neurologic deficits are appreciated. No gait instability. Skin:  Skin is warm, dry and intact. No rash noted. Psychiatric: Mood and affect are normal. Speech and behavior are normal.  ____________________________________________   LABS (all labs ordered are listed, but only abnormal results are displayed)  Labs Reviewed  CULTURE, GROUP A STREP ([facility] ONLY)  POCT RAPID STREP A   ____________________________________________   PROCEDURES  Procedure(s) performed: None  Critical Care performed: No  ____________________________________________   INITIAL IMPRESSION / ASSESSMENT AND PLAN / ED COURSE  Pertinent labs & imaging results that were available during my care of the patient were reviewed by me and considered in my medical decision making (see chart for details).  Acute pharyngitis. Atopic dermatitis to the forehead. Rx given for viscous lidocaine. Chlorpheniramine for drainage. She'll follow up with PCP or return to the ER as needed. Continue current medications for UTI. ____________________________________________   FINAL CLINICAL IMPRESSION(S) / ED  DIAGNOSES  Final diagnoses:  Pharyngitis  Dermatitis, atopic      Evangeline Dakin, PA-C 02/20/15 2131  Darien Ramus, MD 02/20/15 [phone]

## 2015-02-20 NOTE — ED Notes (Signed)
Strep test result negative, culture sent to lab

## 2015-02-20 NOTE — ED Notes (Signed)
Pt presents with burning in her throat and a possible fine rash on her face. Pt states she thinks she noticed a rash on her face since yesterday. has been washing it frequently to help sooth the irritated skin. concerned she may have strep since her throat hurts and the rash developed. Denies fever. Currently taking antibiotic for UTI.

## 2015-02-23 LAB — CULTURE, GROUP A STREP (THRC)

## 2015-06-27 ENCOUNTER — Emergency Department
Admission: EM | Admit: 2015-06-27 | Discharge: 2015-06-27 | Disposition: A | Discharge status: Home / Self Care | Payer: BLUE CROSS/BLUE SHIELD | Attending: Emergency Medicine | Admitting: Emergency Medicine

## 2015-06-27 DIAGNOSIS — F410 Panic disorder [episodic paroxysmal anxiety] without agoraphobia: Secondary | ICD-10-CM

## 2015-06-27 DIAGNOSIS — Z7952 Long term (current) use of systemic steroids: Secondary | ICD-10-CM | POA: Insufficient documentation

## 2015-06-27 DIAGNOSIS — Z79899 Other long term (current) drug therapy: Secondary | ICD-10-CM | POA: Diagnosis not present

## 2015-06-27 DIAGNOSIS — F419 Anxiety disorder, unspecified: Secondary | ICD-10-CM | POA: Insufficient documentation

## 2015-06-27 DIAGNOSIS — Z87891 Personal history of nicotine dependence: Secondary | ICD-10-CM | POA: Insufficient documentation

## 2015-06-27 DIAGNOSIS — N390 Urinary tract infection, site not specified: Secondary | ICD-10-CM

## 2015-06-27 DIAGNOSIS — I10 Essential (primary) hypertension: Secondary | ICD-10-CM | POA: Diagnosis not present

## 2015-06-27 DIAGNOSIS — R350 Frequency of micturition: Secondary | ICD-10-CM | POA: Diagnosis present

## 2015-06-27 LAB — URINALYSIS COMPLETE WITH MICROSCOPIC (ARMC ONLY)
Bacteria, UA: NONE SEEN
Bilirubin Urine: NEGATIVE
Glucose, UA: NEGATIVE mg/dL
KETONES UR: NEGATIVE mg/dL
NITRITE: NEGATIVE
PROTEIN: 30 mg/dL — AB
SPECIFIC GRAVITY, URINE: 1.024 (ref 1.005–1.030)
pH: 7 (ref 5.0–8.0)

## 2015-06-27 MED ORDER — CEPHALEXIN 500 MG PO CAPS: 500.0000 mg | ORAL_CAPSULE | Freq: Three times a day (TID) | ORAL | Status: DC

## 2015-06-27 NOTE — ED Notes (Signed)
Pt c/o painful urination with cloudy urine today

## 2015-06-27 NOTE — Discharge Instructions (Signed)
Urinary Tract Infection A urinary tract infection (UTI) can occur any place along the urinary tract. The tract includes the kidneys, ureters, bladder, and urethra. A type of germ called bacteria often causes a UTI. UTIs are often helped with antibiotic medicine.  HOME CARE   If given, take antibiotics as told by your doctor. Finish them even if you start to feel better.  Drink enough fluids to keep your pee (urine) clear or pale yellow.  Avoid tea, drinks with caffeine, and bubbly (carbonated) drinks.  Pee often. Avoid holding your pee in for a long time.  Pee before and after having sex (intercourse).  Wipe from front to back after you poop (bowel movement) if you are a woman. Use each tissue only once. GET HELP RIGHT AWAY IF:   You have back pain.  You have lower belly (abdominal) pain.  You have chills.  You feel sick to your stomach (nauseous).  You throw up (vomit).  Your burning or discomfort with peeing does not go away.  You have a fever.  Your symptoms are not better in 3 days. MAKE SURE YOU:   Understand these instructions.  Will watch your condition.  Will get help right away if you are not doing well or get worse.   This information is not intended to replace advice given to you by your health care provider. Make sure you discuss any questions you have with your health care provider.   Document Released: 11/17/2007 Document Revised: 06/21/2014 Document Reviewed: 12/30/2011 Elsevier Interactive Patient Education [employer]2016 [employer]   Take the prescription meds as directed. Increase fluid intake to promote regular bladder emptying. [facility] or your provider for follow-up testing as needed.

## 2015-06-27 NOTE — ED Provider Notes (Signed)
[facility] Emergency Department Provider Note ____________________________________________  Time seen: 1615  I have reviewed the triage vital signs and the nursing notes.  HISTORY  Chief Complaint  Urinary Frequency  HPI Roberta Hicks is a 40 y.o. female to the ED for evaluation of sudden onset of painful urination today. She reports a history of recurrent UTIs in the past. She noted that she came in today because usually she develops some significant hematuria shortly after the onset of dysuria.She denies any gross hematuria at this time, but does note some cloudy, malodorous urine. She is also without any significant flank pain, but does note some mild lower abdominal tenderness. She denies any fevers, chills but has noticed some intermittent sweats today. She is without any abnormal vaginal bleeding, nausea, or vomiting. She notes her discomfort at 6/10 in triage.  Past Medical History  Diagnosis Date  . Hypertension    There are no active problems to display for this patient.  Past Surgical History  Procedure Laterality Date  . Tubal ligation      Current Outpatient Rx  Name  Route  Sig  Dispense  Refill  . cephALEXin (KEFLEX) 500 MG capsule   Oral   Take 1 capsule (500 mg total) by mouth 3 (three) times daily.   14 capsule   0   . chlorpheniramine (CHLOR-TRIMETON) 4 MG tablet   Oral   Take 1 tablet (4 mg total) by mouth 2 (two) times daily as needed for allergies or rhinitis.   30 tablet   0   . cyclobenzaprine (FLEXERIL) 10 MG tablet   Oral   Take 1 tablet (10 mg total) by mouth every 8 (eight) hours as needed for muscle spasms.   30 tablet   1   . hydrocortisone cream 0.5 %   Topical   Apply 1 application topically 2 (two) times daily.   30 g   0   . ibuprofen (ADVIL,MOTRIN) 800 MG tablet   Oral   Take 1 tablet (800 mg total) by mouth every 8 (eight) hours as needed.   30 tablet   0   . lidocaine (XYLOCAINE) 2 % solution  Mouth/Throat   Use as directed 20 mLs in the mouth or throat as needed for mouth pain.   100 mL   0   . phenazopyridine (PYRIDIUM) 200 MG tablet   Oral   Take 1 tablet (200 mg total) by mouth 3 (three) times daily as needed for pain.   20 tablet   0   . phenazopyridine (PYRIDIUM) 95 MG tablet      Take 2 tablets (190 mg) by mouth three times daily with meals   12 tablet   0   . predniSONE (DELTASONE) 10 MG tablet      Take 5 tablets daily for 5 days.   25 tablet   0   . ranitidine (ZANTAC) 150 MG capsule   Oral   Take 1 capsule (150 mg total) by mouth 2 (two) times daily.   28 capsule   0   . sucralfate (CARAFATE) 1 G tablet   Oral   Take 1 tablet (1 g total) by mouth 4 (four) times daily.   120 tablet   1   . sulfamethoxazole-trimethoprim (BACTRIM DS) 800-160 MG per tablet   Oral   Take 1 tablet by mouth 2 (two) times daily.   20 tablet   0   . traMADol (ULTRAM) 50 MG tablet   Oral  Take 1 tablet (50 mg total) by mouth every 6 (six) hours as needed.   10 tablet   0    Allergies Review of patient's allergies indicates no known allergies.  No family history on file.  Social History Social History  Substance Use Topics  . Smoking status: Former Smoker -- 0.10 packs/day    Types: Cigarettes  . Smokeless tobacco: Never Used  . Alcohol Use: 0.6 oz/week    1 Standard drinks or equivalent per week   Review of Systems  Constitutional: Negative for fever. Eyes: Negative for visual changes. ENT: Negative for sore throat. Cardiovascular: Negative for chest pain. Respiratory: Negative for shortness of breath. Gastrointestinal: Negative for abdominal pain, vomiting and diarrhea. Genitourinary: Positive for dysuria. Musculoskeletal: Negative for back pain. Skin: Negative for rash. Neurological: Negative for headaches, focal weakness or numbness. ____________________________________________  PHYSICAL EXAM:  VITAL SIGNS: ED Triage Vitals  Enc Vitals  Group     BP 06/27/15 1530 148/88 mmHg     Pulse Rate 06/27/15 1530 108     Resp 06/27/15 1530 18     Temp 06/27/15 1530 97.8 F (36.6 C)     Temp Source 06/27/15 1530 Oral     SpO2 06/27/15 1530 98 %     Weight 06/27/15 1530 170 lb (77.111 kg)     Height 06/27/15 1530 5\' 7"  (1.702 m)     Head Cir --      Peak Flow --      Pain Score 06/27/15 1531 6     Pain Loc --      Pain Edu? --      Excl. in GC? --    Constitutional: Alert and oriented. Well appearing and in no distress. Head: Normocephalic and atraumatic.      Eyes: Conjunctivae are normal. PERRL. Normal extraocular movements      Ears: Canals clear. TMs intact bilaterally.   Nose: No congestion/rhinorrhea.   Mouth/Throat: Mucous membranes are moist.   Neck: Supple. No thyromegaly. Hematological/Lymphatic/Immunological: No cervical lymphadenopathy. Cardiovascular: Normal rate, regular rhythm.  Respiratory: Normal respiratory effort. No wheezes/rales/rhonchi. Gastrointestinal: Soft and nontender. No distention, guarding, or CVA tenderness. Musculoskeletal: Nontender with normal range of motion in all extremities.  Neurologic:  Normal gait without ataxia. Normal speech and language. No gross focal neurologic deficits are appreciated. Skin:  Skin is warm, dry and intact. No rash noted. Psychiatric: Mood and affect are normal. Patient exhibits appropriate insight and judgment. ____________________________________________    LABS (pertinent positives/negatives) Labs Reviewed  URINALYSIS COMPLETEWITH MICROSCOPIC ([facility] ONLY) - Abnormal; Notable for the following:    Color, Urine YELLOW (*)    APPearance CLOUDY (*)    Hgb urine dipstick 2+ (*)    Protein, ur 30 (*)    Leukocytes, UA 2+ (*)    Squamous Epithelial / LPF 0-5 (*)    All other components within normal limits  URINE CULTURE  ___________________________________________  ED ECG REPORT   Date: 06/27/2015  EKG Time: 16:42  Rate: 101  Rhythm: sinus  tachycardia,  normal EKG, normal sinus rhythm, unchanged from previous tracings  Intervals:none  ST&T Change: none  Narrative Interpretation: sinus tachy ____________________________________________  INITIAL IMPRESSION / ASSESSMENT AND PLAN / ED COURSE  Chance with a history of anxiety and depression developed some sudden chest pain while in the treatment room. Her EKG is essentially normal showing only some sinus tachycardia. She otherwise denies any nausea, sweating, shortness of breath, or stabbing chest pain. Her symptoms are resolved without intervention.  Discharged with a prescription for Keflex to dose as directed for UTI. She'll follow-up with her primary care provider or [facility] for retest after one week. ____________________________________________  FINAL CLINICAL IMPRESSION(S) / ED DIAGNOSES  Final diagnoses:  UTI (lower urinary tract infection)  Anxiety attack      Lissa Hoard, PA-C 06/27/15 1723  Myrna Blazer, MD 06/27/15 [phone]

## 2015-06-29 LAB — URINE CULTURE: Special Requests: NORMAL

## 2015-09-11 ENCOUNTER — Emergency Department: Payer: BLUE CROSS/BLUE SHIELD

## 2015-09-11 ENCOUNTER — Emergency Department
Admission: EM | Admit: 2015-09-11 | Discharge: 2015-09-11 | Disposition: A | Discharge status: Home / Self Care | Payer: BLUE CROSS/BLUE SHIELD | Attending: Emergency Medicine | Admitting: Emergency Medicine

## 2015-09-11 ENCOUNTER — Encounter: Payer: Self-pay | Admitting: Emergency Medicine

## 2015-09-11 DIAGNOSIS — Z7952 Long term (current) use of systemic steroids: Secondary | ICD-10-CM | POA: Diagnosis not present

## 2015-09-11 DIAGNOSIS — R05 Cough: Secondary | ICD-10-CM | POA: Diagnosis present

## 2015-09-11 DIAGNOSIS — Z79899 Other long term (current) drug therapy: Secondary | ICD-10-CM | POA: Diagnosis not present

## 2015-09-11 DIAGNOSIS — J209 Acute bronchitis, unspecified: Secondary | ICD-10-CM | POA: Insufficient documentation

## 2015-09-11 DIAGNOSIS — Z792 Long term (current) use of antibiotics: Secondary | ICD-10-CM | POA: Diagnosis not present

## 2015-09-11 DIAGNOSIS — Z87891 Personal history of nicotine dependence: Secondary | ICD-10-CM | POA: Diagnosis not present

## 2015-09-11 DIAGNOSIS — I10 Essential (primary) hypertension: Secondary | ICD-10-CM | POA: Insufficient documentation

## 2015-09-11 IMAGING — CR DG CHEST 2V
1 series · 2 of 2 positions shown · non-contrast
Comparison: [DATE] chest radiograph.

CLINICAL DATA: Productive cough for 4 days.

EXAM:
CHEST  2 VIEW

[Series 1: dg chest 2 view · 0.14mm/px · 2 of 2 slices shown]
[im 1/2]
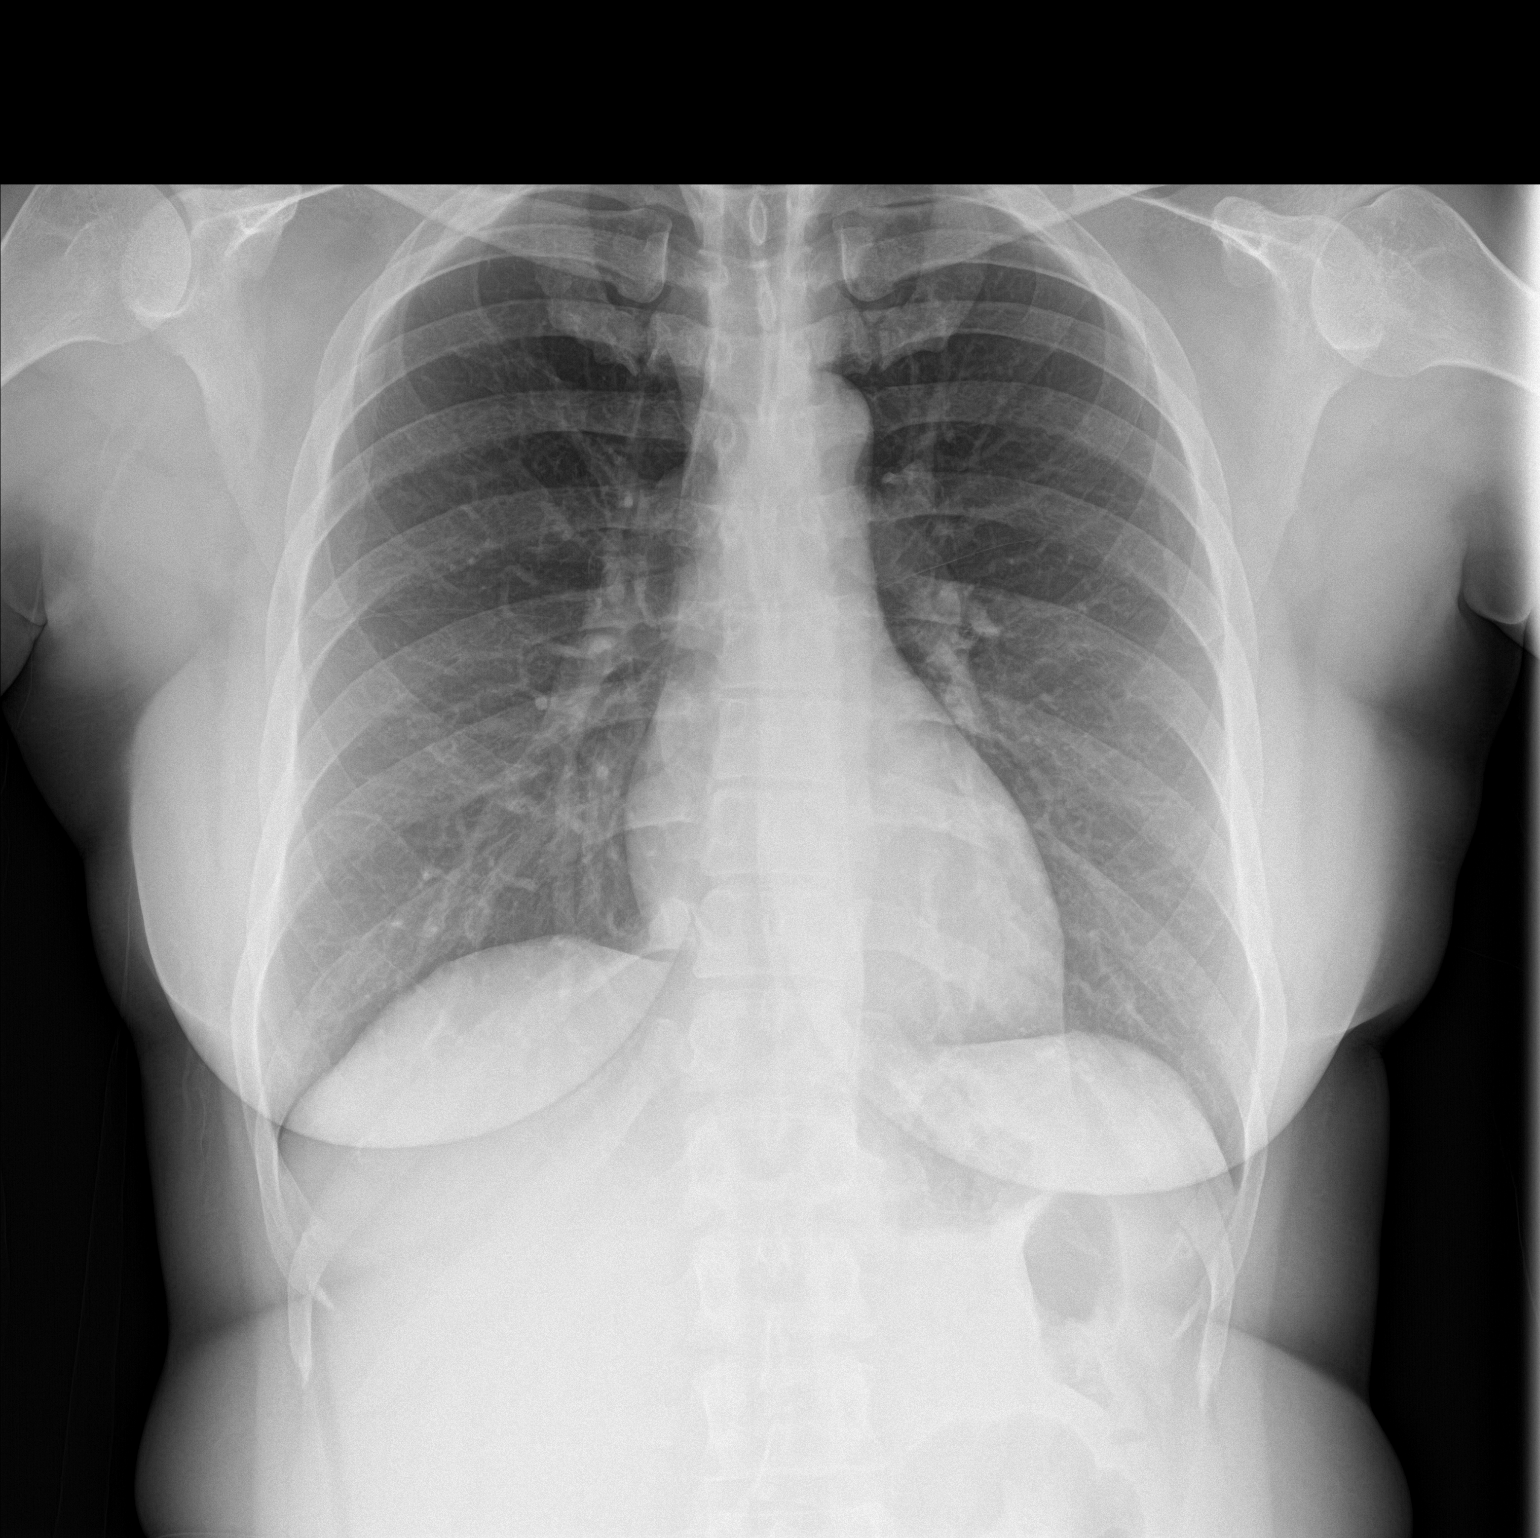
[im 2/2]
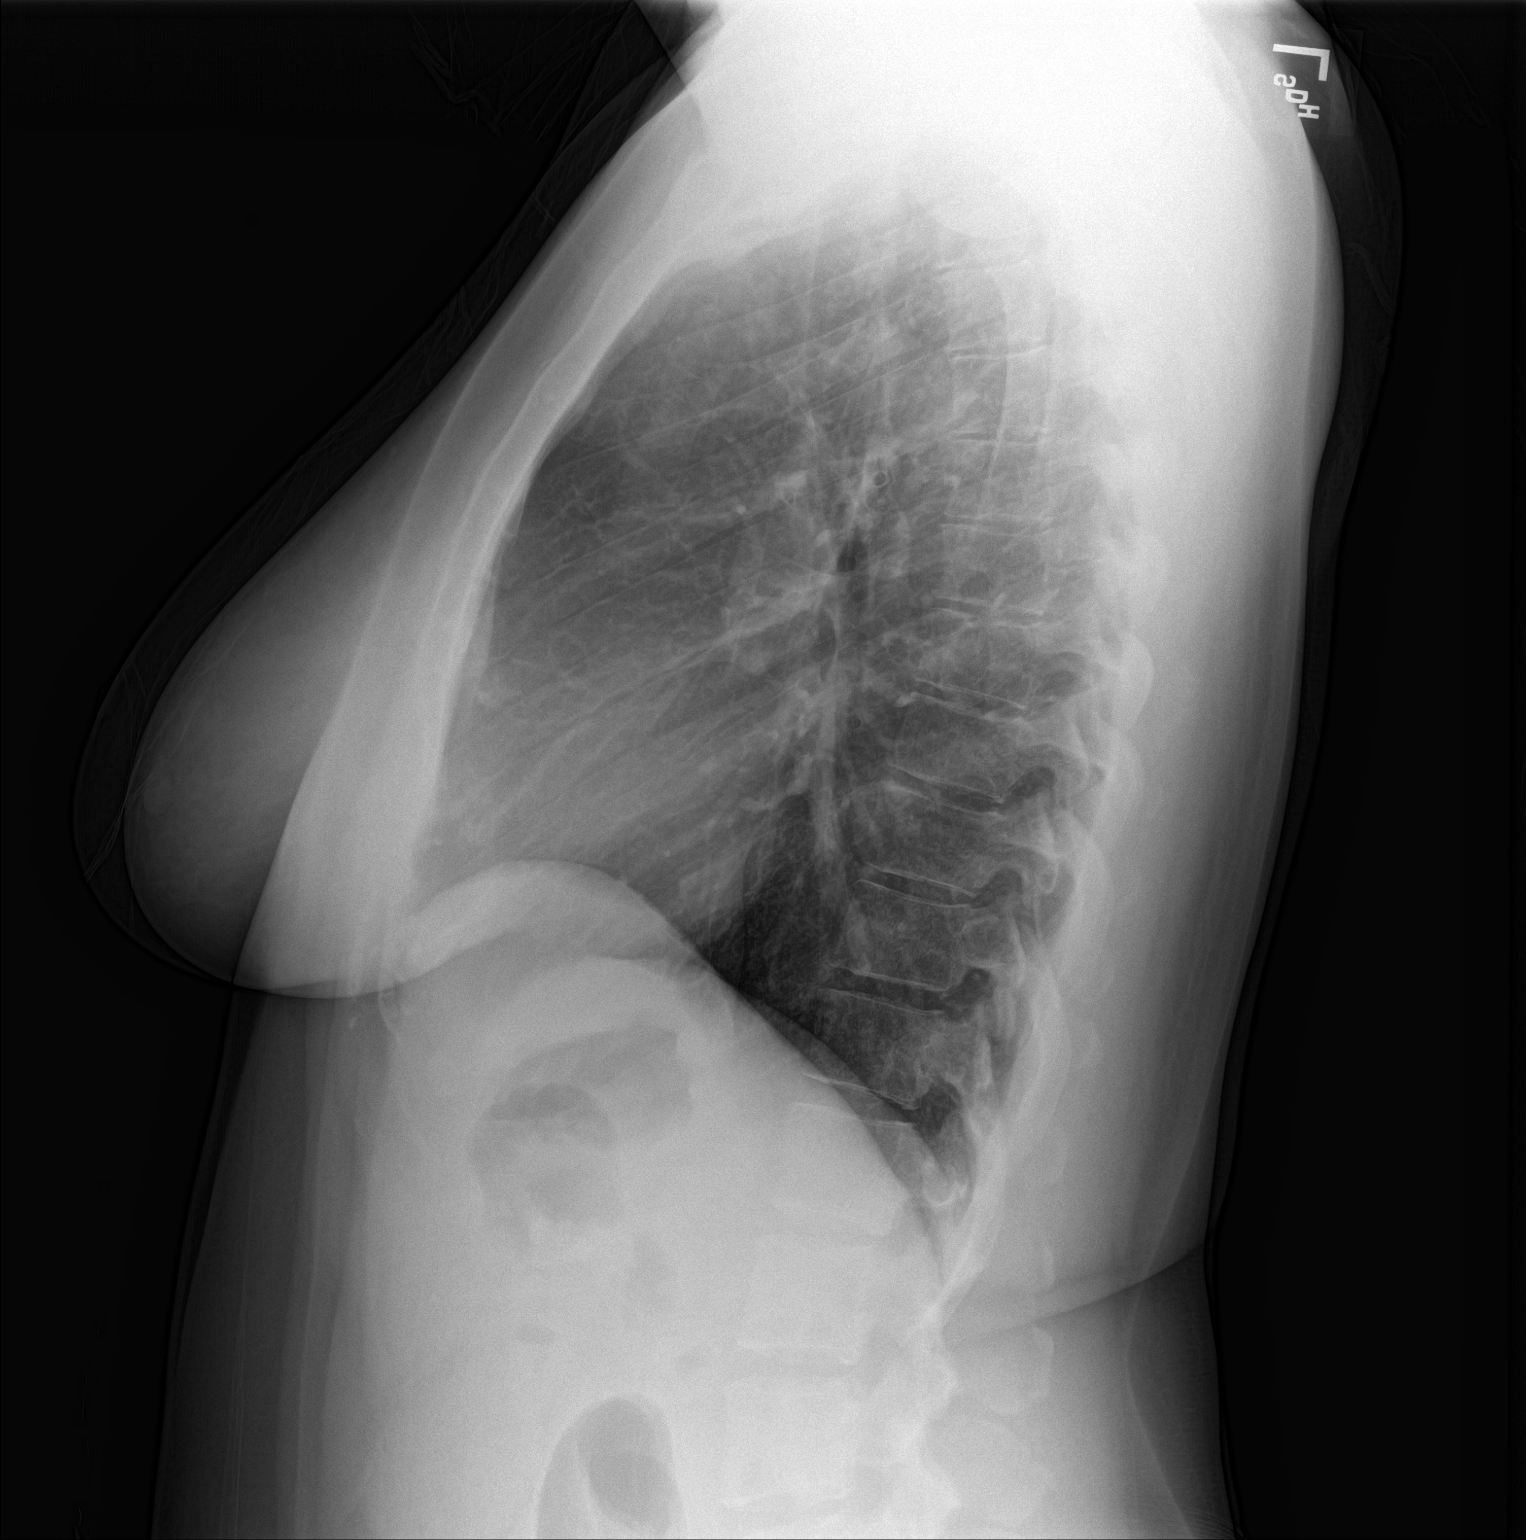

[2 of 2 positions shown; findings below may reference images not displayed]

FINDINGS: Stable cardiomediastinal silhouette with normal heart size. No
pneumothorax. No pleural effusion. Lungs appear clear, with no acute
consolidative airspace disease and no pulmonary edema.
IMPRESSION: No active cardiopulmonary disease.

## 2015-09-11 MED ORDER — FLUTICASONE PROPIONATE 50 MCG/ACT NA SUSP: 1.0000 | Freq: Two times a day (BID) | NASAL | Status: DC

## 2015-09-11 MED ORDER — AZITHROMYCIN 250 MG PO TABS: ORAL_TABLET | ORAL | Status: DC

## 2015-09-11 MED ORDER — PREDNISONE 10 MG PO TABS: 10.0000 mg | ORAL_TABLET | ORAL | Status: DC

## 2015-09-11 MED ORDER — ALBUTEROL SULFATE HFA 108 (90 BASE) MCG/ACT IN AERS: 2.0000 | INHALATION_SPRAY | RESPIRATORY_TRACT | Status: DC | PRN

## 2015-09-11 NOTE — ED Notes (Signed)
Coughing productively with thick mucous since Monday.

## 2015-09-11 NOTE — Discharge Instructions (Signed)

## 2015-09-11 NOTE — ED Notes (Signed)
Developed cough since Monday  Cough is prod  Thick white phlegm  Afebrile on arrival

## 2015-09-11 NOTE — ED Provider Notes (Signed)
[facility] Emergency Department Provider Note  ____________________________________________  Time seen: Approximately 6:09 PM  I have reviewed the triage vital signs and the nursing notes.   HISTORY  Chief Complaint Cough    HPI Roberta Hicks is a 40 y.o. female who presents emergency department complaining of cough 4 days. Patient denies any nasal congestion, sore throat, ear pain. She denies any headache, visual acuity changes, chest pain, abdominal pain, nausea or vomiting. Patient does endorse a cough that is productive in nature. She denies any shortness of breath. Patient is tried over-the-counter medications with minimal to no relief.   Past Medical History  Diagnosis Date  . Hypertension     There are no active problems to display for this patient.   Past Surgical History  Procedure Laterality Date  . Tubal ligation      Current Outpatient Rx  Name  Route  Sig  Dispense  Refill  . venlafaxine XR (EFFEXOR-XR) 150 MG 24 hr capsule   Oral   Take 150 mg by mouth daily with breakfast.         . albuterol (PROVENTIL HFA;VENTOLIN HFA) 108 (90 Base) MCG/ACT inhaler   Inhalation   Inhale 2 puffs into the lungs every 4 (four) hours as needed for wheezing or shortness of breath.   1 Inhaler   0   . azithromycin (ZITHROMAX Z-PAK) 250 MG tablet      Take 2 tablets (500 mg) on  Day 1,  followed by 1 tablet (250 mg) once daily on Days 2 through 5.   6 each   0   . cephALEXin (KEFLEX) 500 MG capsule   Oral   Take 1 capsule (500 mg total) by mouth 3 (three) times daily.   14 capsule   0   . chlorpheniramine (CHLOR-TRIMETON) 4 MG tablet   Oral   Take 1 tablet (4 mg total) by mouth 2 (two) times daily as needed for allergies or rhinitis.   30 tablet   0   . cyclobenzaprine (FLEXERIL) 10 MG tablet   Oral   Take 1 tablet (10 mg total) by mouth every 8 (eight) hours as needed for muscle spasms.   30 tablet   1   . fluticasone  (FLONASE) 50 MCG/ACT nasal spray   Each Nare   Place 1 spray into both nostrils 2 (two) times daily.   16 g   0   . hydrocortisone cream 0.5 %   Topical   Apply 1 application topically 2 (two) times daily.   30 g   0   . ibuprofen (ADVIL,MOTRIN) 800 MG tablet   Oral   Take 1 tablet (800 mg total) by mouth every 8 (eight) hours as needed.   30 tablet   0   . lidocaine (XYLOCAINE) 2 % solution   Mouth/Throat   Use as directed 20 mLs in the mouth or throat as needed for mouth pain.   100 mL   0   . phenazopyridine (PYRIDIUM) 200 MG tablet   Oral   Take 1 tablet (200 mg total) by mouth 3 (three) times daily as needed for pain.   20 tablet   0   . phenazopyridine (PYRIDIUM) 95 MG tablet      Take 2 tablets (190 mg) by mouth three times daily with meals   12 tablet   0   . predniSONE (DELTASONE) 10 MG tablet   Oral   Take 1 tablet (10 mg total) by  mouth as directed.   21 tablet   0     Take on a daily basis of 6, 5, 4, 3, 2, 1   . ranitidine (ZANTAC) 150 MG capsule   Oral   Take 1 capsule (150 mg total) by mouth 2 (two) times daily.   28 capsule   0   . sucralfate (CARAFATE) 1 G tablet   Oral   Take 1 tablet (1 g total) by mouth 4 (four) times daily.   120 tablet   1   . sulfamethoxazole-trimethoprim (BACTRIM DS) 800-160 MG per tablet   Oral   Take 1 tablet by mouth 2 (two) times daily.   20 tablet   0   . traMADol (ULTRAM) 50 MG tablet   Oral   Take 1 tablet (50 mg total) by mouth every 6 (six) hours as needed.   10 tablet   0     Allergies Review of patient's allergies indicates no known allergies.  No family history on file.  Social History Social History  Substance Use Topics  . Smoking status: Former Smoker -- 0.10 packs/day    Types: Cigarettes  . Smokeless tobacco: Never Used  . Alcohol Use: 0.6 oz/week    1 Standard drinks or equivalent per week     Review of Systems  Constitutional: No fever/chills Eyes: No visual changes. No  discharge ENT: No sore throat.Denies nasal congestion. Denies ear pain. Cardiovascular: no chest pain. Respiratory: Positive cough. No SOB. Skin: Negative for rash. Neurological: Negative for headaches, focal weakness or numbness. 10-point ROS otherwise negative.  ____________________________________________   PHYSICAL EXAM:  VITAL SIGNS: ED Triage Vitals  Enc Vitals Group     BP 09/11/15 1726 163/99 mmHg     Pulse Rate 09/11/15 1726 117     Resp 09/11/15 1726 18     Temp 09/11/15 1726 98 F (36.7 C)     Temp Source 09/11/15 1726 Oral     SpO2 09/11/15 1726 97 %     Weight 09/11/15 1726 175 lb (79.379 kg)     Height 09/11/15 1726  (1.702 m)     Head Cir --      Peak Flow --      Pain Score 09/11/15 1727 5     Pain Loc --      Pain Edu? --      Excl. in GC? --      Constitutional: Alert and oriented. Well appearing and in no acute distress. Eyes: Conjunctivae are normal. PERRL. EOMI. Head: Atraumatic. ENT:      Ears:EACs and TMs are unremarkable bilaterally.       Nose: Moderate clear congestion/rhinnorhea.      Mouth/Throat: Mucous membranes are moist.  Neck: No stridor. Neck is supple with full range of motion. Hematological/Lymphatic/Immunilogical: No cervical lymphadenopathy. Cardiovascular: Normal rate, regular rhythm. Normal S1 and S2.  Good peripheral circulation. Respiratory: Normal respiratory effort without tachypnea or retractions. Lungs with scattered expiratory wheezing. No rales or rhonchi. No absent or decreased breath sounds. Neurologic:  Normal speech and language. No gross focal neurologic deficits are appreciated.  Skin:  Skin is warm, dry and intact. No rash noted. Psychiatric: Mood and affect are normal. Speech and behavior are normal. Patient exhibits appropriate insight and judgement.   ____________________________________________   LABS (all labs ordered are listed, but only abnormal results are displayed)  Labs Reviewed - No data to  display ____________________________________________  EKG   ____________________________________________  RADIOLOGY Festus Barren  Aleka Twitty, personally viewed and evaluated these images (plain radiographs) as part of my medical decision making, as well as reviewing the written report by the radiologist.  Dg Chest 2 View  09/11/2015  CLINICAL DATA:  Productive cough for 4 days. EXAM: CHEST  2 VIEW COMPARISON:  08/13/2014 chest radiograph. FINDINGS: Stable cardiomediastinal silhouette with normal heart size. No pneumothorax. No pleural effusion. Lungs appear clear, with no acute consolidative airspace disease and no pulmonary edema. IMPRESSION: No active cardiopulmonary disease. Electronically Signed   By: Delbert PhenixJason A Poff M.D.   On: 09/11/2015 18:00    ____________________________________________    PROCEDURES  Procedure(s) performed:       Medications - No data to display   ____________________________________________   INITIAL IMPRESSION / ASSESSMENT AND PLAN / ED COURSE  Pertinent labs & imaging results that were available during my care of the patient were reviewed by me and considered in my medical decision making (see chart for details).  Patient's diagnosis is consistent with acute bronchitis. Patient's chest x-ray reveals no areas of consolidation.. Patient will be discharged home with prescriptions for albuterol, Zithromax, and Flonase.. Patient is to follow up with primary care provider if symptoms persist past this treatment course. Patient is given ED precautions to return to the ED for any worsening or new symptoms.     ____________________________________________  FINAL CLINICAL IMPRESSION(S) / ED DIAGNOSES  Final diagnoses:  Acute bronchitis, unspecified organism      NEW MEDICATIONS STARTED DURING THIS VISIT:  Discharge Medication List as of 09/11/2015  6:10 PM    START taking these medications   Details  albuterol (PROVENTIL HFA;VENTOLIN HFA) 108  (90 Base) MCG/ACT inhaler Inhale 2 puffs into the lungs every 4 (four) hours as needed for wheezing or shortness of breath., Starting 09/11/2015, Until Discontinued, Print    azithromycin (ZITHROMAX Z-PAK) 250 MG tablet Take 2 tablets (500 mg) on  Day 1,  followed by 1 tablet (250 mg) once daily on Days 2 through 5., Print    fluticasone (FLONASE) 50 MCG/ACT nasal spray Place 1 spray into both nostrils 2 (two) times daily., Starting 09/11/2015, Until Discontinued, Print            This chart was dictated using voice recognition software/Dragon. Despite best efforts to proofread, errors can occur which can change the meaning. Any change was purely unintentional.    Racheal PatchesJonathan D Cameron Katayama, PA-C 09/11/15 2253  Jene Everyobert Kinner, MD 09/11/15 2306

## 2015-11-14 ENCOUNTER — Emergency Department
Admission: EM | Admit: 2015-11-14 | Discharge: 2015-11-14 | Disposition: A | Discharge status: Home / Self Care | Payer: BLUE CROSS/BLUE SHIELD | Attending: Emergency Medicine | Admitting: Emergency Medicine

## 2015-11-14 DIAGNOSIS — Z5321 Procedure and treatment not carried out due to patient leaving prior to being seen by health care provider: Secondary | ICD-10-CM | POA: Diagnosis not present

## 2015-11-14 DIAGNOSIS — R109 Unspecified abdominal pain: Secondary | ICD-10-CM | POA: Diagnosis present

## 2015-11-14 DIAGNOSIS — Z87891 Personal history of nicotine dependence: Secondary | ICD-10-CM | POA: Insufficient documentation

## 2015-11-14 DIAGNOSIS — Z79899 Other long term (current) drug therapy: Secondary | ICD-10-CM | POA: Diagnosis not present

## 2015-11-14 DIAGNOSIS — I10 Essential (primary) hypertension: Secondary | ICD-10-CM | POA: Diagnosis not present

## 2015-11-14 LAB — URINALYSIS COMPLETE WITH MICROSCOPIC (ARMC ONLY)
Bacteria, UA: NONE SEEN
Bilirubin Urine: NEGATIVE
Glucose, UA: NEGATIVE mg/dL
KETONES UR: NEGATIVE mg/dL
Leukocytes, UA: NEGATIVE
Nitrite: NEGATIVE
PH: 6 (ref 5.0–8.0)
PROTEIN: NEGATIVE mg/dL
Specific Gravity, Urine: 1.019 (ref 1.005–1.030)

## 2015-11-14 LAB — COMPREHENSIVE METABOLIC PANEL
ALT: 15 U/L (ref 14–54)
ANION GAP: 8 (ref 5–15)
AST: 22 U/L (ref 15–41)
Albumin: 4.2 g/dL (ref 3.5–5.0)
Alkaline Phosphatase: 83 U/L (ref 38–126)
BUN: 9 mg/dL (ref 6–20)
CHLORIDE: 104 mmol/L (ref 101–111)
CO2: 27 mmol/L (ref 22–32)
Calcium: 9.1 mg/dL (ref 8.9–10.3)
Creatinine, Ser: 0.78 mg/dL (ref 0.44–1.00)
GFR calc non Af Amer: >60 mL/min (ref >60)
Glucose, Bld: 113 mg/dL — ABNORMAL HIGH (ref 65–99)
Potassium: 3.6 mmol/L (ref 3.5–5.1)
SODIUM: 139 mmol/L (ref 135–145)
Total Bilirubin: 0.4 mg/dL (ref 0.3–1.2)
Total Protein: 7.6 g/dL (ref 6.5–8.1)

## 2015-11-14 LAB — CBC
HCT: 38.7 % (ref 35.0–47.0)
HEMOGLOBIN: 13.2 g/dL (ref 12.0–16.0)
MCH: 30.4 pg (ref 26.0–34.0)
MCHC: 34 g/dL (ref 32.0–36.0)
MCV: 89.4 fL (ref 80.0–100.0)
Platelets: 334 10*3/uL (ref 150–440)
RBC: 4.33 MIL/uL (ref 3.80–5.20)
RDW: 14.2 % (ref 11.5–14.5)
WBC: 6 10*3/uL (ref 3.6–11.0)

## 2015-11-14 LAB — LIPASE, BLOOD: LIPASE: 27 U/L (ref 11–51)

## 2015-11-14 NOTE — ED Notes (Signed)
Pt [place] that [place] and states that she has been having some abd discomfort and vaginal discharge

## 2015-11-17 LAB — POCT PREGNANCY, URINE: Preg Test, Ur: NEGATIVE

## 2016-03-02 ENCOUNTER — Encounter: Payer: Self-pay | Admitting: Emergency Medicine

## 2016-03-02 ENCOUNTER — Emergency Department
Admission: EM | Admit: 2016-03-02 | Discharge: 2016-03-02 | Disposition: A | Discharge status: Home / Self Care | Payer: BLUE CROSS/BLUE SHIELD | Attending: Emergency Medicine | Admitting: Emergency Medicine

## 2016-03-02 DIAGNOSIS — Z791 Long term (current) use of non-steroidal anti-inflammatories (NSAID): Secondary | ICD-10-CM | POA: Insufficient documentation

## 2016-03-02 DIAGNOSIS — Z79899 Other long term (current) drug therapy: Secondary | ICD-10-CM | POA: Insufficient documentation

## 2016-03-02 DIAGNOSIS — G43909 Migraine, unspecified, not intractable, without status migrainosus: Secondary | ICD-10-CM

## 2016-03-02 DIAGNOSIS — F17210 Nicotine dependence, cigarettes, uncomplicated: Secondary | ICD-10-CM | POA: Insufficient documentation

## 2016-03-02 DIAGNOSIS — I10 Essential (primary) hypertension: Secondary | ICD-10-CM | POA: Diagnosis not present

## 2016-03-02 HISTORY — DX: Migraine, unspecified, not intractable, without status migrainosus: G43.909

## 2016-03-02 MED ORDER — KETOROLAC TROMETHAMINE 30 MG/ML IJ SOLN
30.0000 mg | Freq: Once | INTRAMUSCULAR | Status: AC
  Administered 2016-03-02: 30 mg via INTRAVENOUS
  Filled 2016-03-02: qty 1

## 2016-03-02 MED ORDER — METOCLOPRAMIDE HCL 5 MG/ML IJ SOLN
10.0000 mg | Freq: Once | INTRAMUSCULAR | Status: AC
  Administered 2016-03-02: 10 mg via INTRAVENOUS
  Filled 2016-03-02: qty 2

## 2016-03-02 MED ORDER — DIPHENHYDRAMINE HCL 50 MG/ML IJ SOLN
50.0000 mg | Freq: Once | INTRAMUSCULAR | Status: AC
  Administered 2016-03-02: 50 mg via INTRAVENOUS
  Filled 2016-03-02: qty 1

## 2016-03-02 MED ORDER — BUTALBITAL-APAP-CAFFEINE 50-325-40 MG PO TABS: 1.0000 | ORAL_TABLET | Freq: Four times a day (QID) | ORAL | 0 refills | Status: AC | PRN

## 2016-03-02 MED ORDER — SODIUM CHLORIDE 0.9 % IV BOLUS (SEPSIS)
1000.0000 mL | Freq: Once | INTRAVENOUS | Status: AC
  Administered 2016-03-02: 1000 mL via INTRAVENOUS

## 2016-03-02 NOTE — ED Provider Notes (Signed)
[facility] Emergency Department Provider Note  Time seen: 11:09 AM  I have reviewed the triage vital signs and the nursing notes.   HISTORY  Chief Complaint Headache    HPI Roberta Hicks is a 40 y.o. female with a past medical history of hypertension, migraines, presents the emergency department with a headache. According to the patient for the past 4 days she has been experiencing a constant dull headache. Patient states a history of migraine headaches which feel very similar to today's headache, but states they typically go away after taking migraine medication. The patient has been taking over-the-counter migraine medication without relief. She states she has had headaches lasting multiple days in the past. Denies fever, nausea or vomiting. Does state photo and phonophobia. Denies any focal weakness or numbness.  Past Medical History:  Diagnosis Date  . Hypertension   . Migraines     There are no active problems to display for this patient.    Past Surgical History:  Procedure Laterality Date  . TUBAL LIGATION      Prior to Admission medications   Medication Sig Start Date End Date Taking? Authorizing Provider  albuterol (PROVENTIL HFA;VENTOLIN HFA) 108 (90 Base) MCG/ACT inhaler Inhale 2 puffs into the lungs every 4 (four) hours as needed for wheezing or shortness of breath. 09/11/15   Delorise Royals Cuthriell, PA-C  azithromycin (ZITHROMAX Z-PAK) 250 MG tablet Take 2 tablets (500 mg) on  Day 1,  followed by 1 tablet (250 mg) once daily on Days 2 through 5. 09/11/15   Christiane Ha D Cuthriell, PA-C  cephALEXin (KEFLEX) 500 MG capsule Take 1 capsule (500 mg total) by mouth 3 (three) times daily. 06/27/15   Jenise V Bacon Menshew, PA-C  chlorpheniramine (CHLOR-TRIMETON) 4 MG tablet Take 1 tablet (4 mg total) by mouth 2 (two) times daily as needed for allergies or rhinitis. 02/20/15   Charmayne Sheer Beers, PA-C  cyclobenzaprine (FLEXERIL) 10 MG tablet Take 1 tablet (10 mg  total) by mouth every 8 (eight) hours as needed for muscle spasms. 11/25/14   Charmayne Sheer Beers, PA-C  fluticasone (FLONASE) 50 MCG/ACT nasal spray Place 1 spray into both nostrils 2 (two) times daily. 09/11/15   Delorise Royals Cuthriell, PA-C  hydrocortisone cream 0.5 % Apply 1 application topically 2 (two) times daily. 02/20/15   Charmayne Sheer Beers, PA-C  ibuprofen (ADVIL,MOTRIN) 800 MG tablet Take 1 tablet (800 mg total) by mouth every 8 (eight) hours as needed. 11/25/14   Charmayne Sheer Beers, PA-C  lidocaine (XYLOCAINE) 2 % solution Use as directed 20 mLs in the mouth or throat as needed for mouth pain. 02/20/15   Evangeline Dakin, PA-C  phenazopyridine (PYRIDIUM) 95 MG tablet Take 2 tablets (190 mg) by mouth three times daily with meals 12/24/14   Loleta Rose, MD  predniSONE (DELTASONE) 10 MG tablet Take 1 tablet (10 mg total) by mouth as directed. 09/11/15   Delorise Royals Cuthriell, PA-C  ranitidine (ZANTAC) 150 MG capsule Take 1 capsule (150 mg total) by mouth 2 (two) times daily. 12/29/14   Sharman Cheek, MD  sucralfate (CARAFATE) 1 G tablet Take 1 tablet (1 g total) by mouth 4 (four) times daily. 12/29/14   Sharman Cheek, MD  sulfamethoxazole-trimethoprim (BACTRIM DS) 800-160 MG per tablet Take 1 tablet by mouth 2 (two) times daily. 02/18/15   Emily Filbert, MD  venlafaxine XR (EFFEXOR-XR) 150 MG 24 hr capsule Take 150 mg by mouth daily with breakfast.    Historical Provider,  MD    No Known Allergies  History reviewed. No pertinent family history.  Social History Social History  Substance Use Topics  . Smoking status: Current Every Day Smoker    Packs/day: 0.10    Types: Cigarettes  . Smokeless tobacco: Never Used  . Alcohol use 0.6 oz/week    1 Standard drinks or equivalent per week    Review of Systems Constitutional: Negative for fever Cardiovascular: Negative for chest pain. Respiratory: Negative for shortness of breath. Gastrointestinal: Negative for abdominal pain Genitourinary:  Negative for dysuria. Musculoskeletal: Negative for back pain. Neurological: Moderate headache. Denies focal weakness or numbness. 10-point ROS otherwise negative.  ____________________________________________   PHYSICAL EXAM:  VITAL SIGNS: ED Triage Vitals  Enc Vitals Group     BP 03/02/16 0900 (!) 153/98     Pulse Rate 03/02/16 0900 (!) 112     Resp 03/02/16 0900 18     Temp 03/02/16 0900 98.2 F (36.8 C)     Temp Source 03/02/16 0900 Oral     SpO2 03/02/16 0900 98 %     Weight 03/02/16 0901 180 lb (81.6 kg)     Height 03/02/16 0901 5\' 7"  (1.702 m)     Head Circumference --      Peak Flow --      Pain Score 03/02/16 0901 9     Pain Loc --      Pain Edu? --      Excl. in GC? --     Constitutional: Alert and oriented. Well appearing and in no distress. Eyes: Normal exam, Mild photophobia, PERRL. ENT   Head: Normocephalic and atraumatic.   Mouth/Throat: Mucous membranes are moist. Cardiovascular: Normal rate, regular rhythm. No murmur Respiratory: Normal respiratory effort without tachypnea nor retractions. Breath sounds are clear  Gastrointestinal: Soft and nontender. No distention.   Musculoskeletal: Nontender with normal range of motion in all extremities.  Neurologic:  Normal speech and language. No gross focal neurologic deficits. Equal grip strength. 5/5 motor in all extremities. No cranial nerve deficits. Skin:  Skin is warm, dry and intact.  Psychiatric: Mood and affect are normal.   ____________________________________________   INITIAL IMPRESSION / ASSESSMENT AND PLAN / ED COURSE  Pertinent labs & imaging results that were available during my care of the patient were reviewed by me and considered in my medical decision making (see chart for details).  The patient presents to the emergency department with a migraine headache lasting several days. Patient states a history of similar migraines in the past. She appears well with a normal neurological  exam. Denies any concerning symptoms such as fever or sudden onset. We will start an IV, dose fluids, Toradol, Benadryl, Reglan. Patient is agreeable to this plan.  Patient states the headache is nearly gone now. We'll discharge home with Fioricet if needed. I discussed return precautions, and the patient is agreeable.  ____________________________________________   FINAL CLINICAL IMPRESSION(S) / ED DIAGNOSES  Migraine headache    Minna AntisKevin Bernadette Gores, MD 03/02/16 1239

## 2016-03-02 NOTE — ED Triage Notes (Signed)
Pt to ed with c/o elevated blood pressure, reports headache started on Saturday.

## 2016-06-26 ENCOUNTER — Encounter: Payer: Self-pay | Admitting: Emergency Medicine

## 2016-06-26 ENCOUNTER — Emergency Department
Admission: EM | Admit: 2016-06-26 | Discharge: 2016-06-26 | Disposition: A | Discharge status: Home / Self Care | Payer: BLUE CROSS/BLUE SHIELD | Attending: Emergency Medicine | Admitting: Emergency Medicine

## 2016-06-26 DIAGNOSIS — F17210 Nicotine dependence, cigarettes, uncomplicated: Secondary | ICD-10-CM | POA: Insufficient documentation

## 2016-06-26 DIAGNOSIS — J069 Acute upper respiratory infection, unspecified: Secondary | ICD-10-CM | POA: Insufficient documentation

## 2016-06-26 DIAGNOSIS — Z79899 Other long term (current) drug therapy: Secondary | ICD-10-CM | POA: Insufficient documentation

## 2016-06-26 DIAGNOSIS — I10 Essential (primary) hypertension: Secondary | ICD-10-CM | POA: Insufficient documentation

## 2016-06-26 LAB — POCT RAPID STREP A: STREPTOCOCCUS, GROUP A SCREEN (DIRECT): NEGATIVE

## 2016-06-26 MED ORDER — AMOXICILLIN 875 MG PO TABS: 875.0000 mg | ORAL_TABLET | Freq: Two times a day (BID) | ORAL | 0 refills | Status: DC

## 2016-06-26 MED ORDER — PSEUDOEPH-BROMPHEN-DM 30-2-10 MG/5ML PO SYRP: 10.0000 mL | ORAL_SOLUTION | Freq: Four times a day (QID) | ORAL | 0 refills | Status: DC | PRN

## 2016-06-26 NOTE — ED Triage Notes (Addendum)
Pt reports sore throat x 2 weeks, pain with swallowing. Pt reports her throat is so dry that it hurts to swallow. Pt had cough as well, which is improving.  Pt using humidifier. Subjective fevers.  Pt has hx of HTN but has not been taking meds like she is supposed to.

## 2016-06-26 NOTE — ED Provider Notes (Signed)
[facility] Emergency Department Provider Note  ____________________________________________  Time seen: Approximately 4:01 PM  I have reviewed the triage vital signs and the nursing notes.   HISTORY  Chief Complaint Sore Throat    HPI Roberta Hicks is a 41 y.o. female , NAD, presents to the emergency room with 2 week history of upper respiratory symptoms. Patient states that since began 2 weeks ago with nasal congestion, runny nose, sinus pressure and ear pressure. Since that time she has had also had onset of cough and chest congestion. Most of these symptoms have resolved but she continues to have sore throat and dryness about the throat. Denies any swelling about the lips/tongue/throat. No fevers, chills or body aches. No headaches, chest pain, shortness of breath. No abdominal pain, nausea or vomiting. No known sick contacts. Has been taking over-the-counter medication without significant relief of symptoms.   Past Medical History:  Diagnosis Date  . Hypertension   . Migraines     There are no active problems to display for this patient.   Past Surgical History:  Procedure Laterality Date  . TUBAL LIGATION      Prior to Admission medications   Medication Sig Start Date End Date Taking? Authorizing Provider  albuterol (PROVENTIL HFA;VENTOLIN HFA) 108 (90 Base) MCG/ACT inhaler Inhale 2 puffs into the lungs every 4 (four) hours as needed for wheezing or shortness of breath. 09/11/15   Delorise Royals Cuthriell, PA-C  amoxicillin (AMOXIL) 875 MG tablet Take 1 tablet (875 mg total) by mouth 2 (two) times daily. 06/26/16   Mackenzie Groom L Kekai Geter, PA-C  azithromycin (ZITHROMAX Z-PAK) 250 MG tablet Take 2 tablets (500 mg) on  Day 1,  followed by 1 tablet (250 mg) once daily on Days 2 through 5. 09/11/15   Christiane Ha D Cuthriell, PA-C  brompheniramine-pseudoephedrine-DM 30-2-10 MG/5ML syrup Take 10 mLs by mouth 4 (four) times daily as needed. 06/26/16   Kahlin Mark L Burke Terry, PA-C   butalbital-acetaminophen-caffeine (FIORICET, ESGIC) 50-325-40 MG tablet Take 1-2 tablets by mouth every 6 (six) hours as needed for headache. 03/02/16 03/02/17  Minna Antis, MD  cephALEXin (KEFLEX) 500 MG capsule Take 1 capsule (500 mg total) by mouth 3 (three) times daily. 06/27/15   Jenise V Bacon Menshew, PA-C  chlorpheniramine (CHLOR-TRIMETON) 4 MG tablet Take 1 tablet (4 mg total) by mouth 2 (two) times daily as needed for allergies or rhinitis. 02/20/15   Charmayne Sheer Beers, PA-C  cyclobenzaprine (FLEXERIL) 10 MG tablet Take 1 tablet (10 mg total) by mouth every 8 (eight) hours as needed for muscle spasms. 11/25/14   Charmayne Sheer Beers, PA-C  fluticasone (FLONASE) 50 MCG/ACT nasal spray Place 1 spray into both nostrils 2 (two) times daily. 09/11/15   Delorise Royals Cuthriell, PA-C  hydrocortisone cream 0.5 % Apply 1 application topically 2 (two) times daily. 02/20/15   Charmayne Sheer Beers, PA-C  ibuprofen (ADVIL,MOTRIN) 800 MG tablet Take 1 tablet (800 mg total) by mouth every 8 (eight) hours as needed. 11/25/14   Charmayne Sheer Beers, PA-C  lidocaine (XYLOCAINE) 2 % solution Use as directed 20 mLs in the mouth or throat as needed for mouth pain. 02/20/15   Evangeline Dakin, PA-C  phenazopyridine (PYRIDIUM) 95 MG tablet Take 2 tablets (190 mg) by mouth three times daily with meals 12/24/14   Loleta Rose, MD  predniSONE (DELTASONE) 10 MG tablet Take 1 tablet (10 mg total) by mouth as directed. 09/11/15   Delorise Royals Cuthriell, PA-C  ranitidine (ZANTAC) 150 MG capsule  Take 1 capsule (150 mg total) by mouth 2 (two) times daily. 12/29/14   Sharman Cheek, MD  sucralfate (CARAFATE) 1 G tablet Take 1 tablet (1 g total) by mouth 4 (four) times daily. 12/29/14   Sharman Cheek, MD  sulfamethoxazole-trimethoprim (BACTRIM DS) 800-160 MG per tablet Take 1 tablet by mouth 2 (two) times daily. 02/18/15   Emily Filbert, MD  venlafaxine XR (EFFEXOR-XR) 150 MG 24 hr capsule Take 150 mg by mouth daily with breakfast.    Historical  Provider, MD    Allergies Patient has no known allergies.  History reviewed. No pertinent family history.  Social History Social History  Substance Use Topics  . Smoking status: Current Every Day Smoker    Packs/day: 0.10    Types: Cigarettes  . Smokeless tobacco: Never Used  . Alcohol use 0.6 oz/week    1 Standard drinks or equivalent per week     Review of Systems Constitutional: No fever/chills ENT: Positive sore throat, nasal congestion. No sinus pressure, ear pressure, tinnitus, ear drainage Cardiovascular: No chest pain. Respiratory: Positive cough, chest congestion. No shortness of breath. No wheezing.  Gastrointestinal: No abdominal pain.  No nausea, vomiting.  No diarrhea. Musculoskeletal: Negative for general myalgias.  Skin: Negative for rash. Neurological: Negative for headaches, focal weakness or numbness. 10-point ROS otherwise negative.  ____________________________________________   PHYSICAL EXAM:  VITAL SIGNS: ED Triage Vitals [06/26/16 1524]  Enc Vitals Group     BP (!) 172/104     Pulse Rate (!) 104     Resp 18     Temp 97.8 F (36.6 C)     Temp Source Oral     SpO2 100 %     Weight 180 lb (81.6 kg)     Height 5\' 7"  (1.702 m)     Head Circumference      Peak Flow      Pain Score 8     Pain Loc      Pain Edu?      Excl. in GC?      Constitutional: Alert and oriented. Well appearing and in no acute distress. Eyes: Conjunctivae are normal.  Head: Atraumatic. ENT:      Ears: TMs visualized bilaterally with moderate serous effusion and mild bulging but no perforation or erythema.      Nose: Trace congestion with trace clear rhinorrhea. Turbinates are bilaterally erythematous.      Mouth/Throat: Mucous membranes are moist. Pharynx with mild injection but no swelling or exudate. Uvula is midline. Airways patent. Neck: No stridor. Supple with full range of motion. Hematological/Lymphatic/Immunilogical: No cervical  lymphadenopathy. Cardiovascular: Normal rate, regular rhythm. Normal S1 and S2.  Good peripheral circulation. Respiratory: Normal respiratory effort without tachypnea or retractions. Lungs CTAB with breath sounds noted in all lung fields. No wheeze, rhonchi, rales Neurologic:  Normal speech and language. No gross focal neurologic deficits are appreciated.  Skin:  Skin is warm, dry and intact. No rash noted. Psychiatric: Mood and affect are normal. Speech and behavior are normal. Patient exhibits appropriate insight and judgement.   ____________________________________________   LABS (all labs ordered are listed, but only abnormal results are displayed)  Labs Reviewed  CULTURE, GROUP A STREP [facility])  POCT RAPID STREP A   ____________________________________________  EKG  None ____________________________________________  RADIOLOGY  None ____________________________________________    PROCEDURES  Procedure(s) performed: None   Procedures   Medications - No data to display   ____________________________________________   INITIAL IMPRESSION / ASSESSMENT AND PLAN /  ED COURSE  Pertinent labs & imaging results that were available during my care of the patient were reviewed by me and considered in my medical decision making (see chart for details).  Clinical Course     Patient's diagnosis is consistent with Upper respiratory infection. Patient will be discharged home with prescriptions for amoxicillin and Bromfed-DM to take as directed. [facility] if symptoms persist past this treatment course. Patient is given ED precautions to return to the ED for any worsening or new symptoms.    ____________________________________________  FINAL CLINICAL IMPRESSION(S) / ED DIAGNOSES  Final diagnoses:  Upper respiratory tract infection, unspecified type      NEW MEDICATIONS STARTED DURING THIS VISIT:  Discharge Medication List as of  06/26/2016  4:51 PM    START taking these medications   Details  amoxicillin (AMOXIL) 875 MG tablet Take 1 tablet (875 mg total) by mouth 2 (two) times daily., Starting Sat 06/26/2016, Print    brompheniramine-pseudoephedrine-DM 30-2-10 MG/5ML syrup Take 10 mLs by mouth 4 (four) times daily as needed., Starting Sat 06/26/2016, Print             Hope PigeonJami L Maressa Apollo, PA-C 06/26/16 1739    Myrna Blazeravid Matthew Schaevitz, MD 06/26/16 2252

## 2016-06-29 LAB — CULTURE, GROUP A STREP (THRC)

## 2017-05-29 ENCOUNTER — Encounter: Payer: Self-pay | Admitting: Emergency Medicine

## 2017-05-29 ENCOUNTER — Other Ambulatory Visit: Payer: Self-pay

## 2017-05-29 ENCOUNTER — Emergency Department
Admission: EM | Admit: 2017-05-29 | Discharge: 2017-05-29 | Disposition: A | Discharge status: Home / Self Care | Payer: BLUE CROSS/BLUE SHIELD | Attending: Emergency Medicine | Admitting: Emergency Medicine

## 2017-05-29 DIAGNOSIS — Y929 Unspecified place or not applicable: Secondary | ICD-10-CM | POA: Insufficient documentation

## 2017-05-29 DIAGNOSIS — Y9389 Activity, other specified: Secondary | ICD-10-CM | POA: Insufficient documentation

## 2017-05-29 DIAGNOSIS — Z79899 Other long term (current) drug therapy: Secondary | ICD-10-CM | POA: Insufficient documentation

## 2017-05-29 DIAGNOSIS — I10 Essential (primary) hypertension: Secondary | ICD-10-CM | POA: Insufficient documentation

## 2017-05-29 DIAGNOSIS — S0502XA Injury of conjunctiva and corneal abrasion without foreign body, left eye, initial encounter: Secondary | ICD-10-CM | POA: Insufficient documentation

## 2017-05-29 DIAGNOSIS — X58XXXA Exposure to other specified factors, initial encounter: Secondary | ICD-10-CM | POA: Insufficient documentation

## 2017-05-29 DIAGNOSIS — F17210 Nicotine dependence, cigarettes, uncomplicated: Secondary | ICD-10-CM | POA: Insufficient documentation

## 2017-05-29 DIAGNOSIS — Y990 Civilian activity done for income or pay: Secondary | ICD-10-CM | POA: Insufficient documentation

## 2017-05-29 MED ORDER — FLUORESCEIN SODIUM 1 MG OP STRP
1.0000 | ORAL_STRIP | Freq: Once | OPHTHALMIC | Status: AC
  Administered 2017-05-29: 1 via OPHTHALMIC
  Filled 2017-05-29: qty 1

## 2017-05-29 MED ORDER — EYE WASH OPHTH SOLN
1.0000 [drp] | OPHTHALMIC | Status: DC | PRN
  Filled 2017-05-29: qty 118

## 2017-05-29 MED ORDER — TOBRAMYCIN 0.3 % OP SOLN: 2.0000 [drp] | OPHTHALMIC | 0 refills | Status: DC

## 2017-05-29 MED ORDER — TETRACAINE HCL 0.5 % OP SOLN
1.0000 [drp] | Freq: Once | OPHTHALMIC | Status: AC
  Administered 2017-05-29: 1 [drp] via OPHTHALMIC
  Filled 2017-05-29: qty 4

## 2017-05-29 NOTE — ED Provider Notes (Signed)
[facility] Emergency Department Provider Note  ____________________________________________   First MD Initiated Contact with Patient 05/29/17 1103     (approximate)  I have reviewed the triage vital signs and the nursing notes.   HISTORY  Chief Complaint Eye Pain   HPI Roberta Hicks is a 41 y.o. female complaint of left eye pain. Patient states that she broke this morning with drainage to her left eye. She states that she feels as if she either scratched her eye got a foreign body in it yesterday. She noticed some redness. There is increased pain with bright light exposure.  Patient also states she has a habit of putting her eyelashes out and may have scratched it at that time. Currently she rates her pain as 10 over 10.   Past Medical History:  Diagnosis Date  . Hypertension   . Migraines     There are no active problems to display for this patient.   Past Surgical History:  Procedure Laterality Date  . TUBAL LIGATION      Prior to Admission medications   Medication Sig Start Date End Date Taking? Authorizing Provider  hydrocortisone cream 0.5 % Apply 1 application topically 2 (two) times daily. 02/20/15   Beers, Charmayne Sheerharles M, PA-C  tobramycin (TOBREX) 0.3 % ophthalmic solution Place 2 drops into the left eye every 4 (four) hours. While awake 05/29/17   Tommi RumpsSummers, Rhonda L, PA-C  venlafaxine XR (EFFEXOR-XR) 150 MG 24 hr capsule Take 150 mg by mouth daily with breakfast.    [provider]    Allergies Patient has no known allergies.  No family history on file.  Social History Social History   Tobacco Use  . Smoking status: Current Every Day Smoker    Packs/day: 0.10    Types: Cigarettes  . Smokeless tobacco: Never Used  Substance Use Topics  . Alcohol use: Yes    Alcohol/week: 0.6 oz    Types: 1 Standard drinks or equivalent per week  . Drug use: No    Review of Systems Constitutional: No fever/chills Eyes: positive  photophobia Cardiovascular: Denies chest pain. Respiratory: Denies shortness of breath. Gastrointestinal:   No nausea, no vomiting.  Skin: Negative for rash. Neurological: Negative for headaches, focal weakness or numbness. ____________________________________________   PHYSICAL EXAM:  VITAL SIGNS: ED Triage Vitals  Enc Vitals Group     BP 05/29/17 1042 (!) 173/107     Pulse Rate 05/29/17 1042 (!) 104     Resp 05/29/17 1042 18     Temp 05/29/17 1042 98.1 F (36.7 C)     Temp Source 05/29/17 1042 Oral     SpO2 05/29/17 1042 98 %     Weight 05/29/17 1043 195 lb (88.5 kg)     Height 05/29/17 1043 5\' 6"  (1.676 m)     Head Circumference --      Peak Flow --      Pain Score 05/29/17 1042 10     Pain Loc --      Pain Edu? --      Excl. in GC? --    Constitutional: Alert and oriented. Well appearing and in no acute distress.  Patient is sitting a dark room. Eyes: Conjunctivae are normal on the right. Conjunctiva on the left is injected.  PERRL. EOMI. Head: Atraumatic. Nose: No congestion/rhinnorhea. Neck: No stridor.   Cardiovascular: Normal rate, regular rhythm. Grossly normal heart sounds.  Good peripheral circulation. Respiratory: Normal respiratory effort.  No retractions. Lungs CTAB.  Musculoskeletal: moves upper and lower extremities without difficulty normal gait was noted. Neurologic:  Normal speech and language. No gross focal neurologic deficits are appreciated.  Skin:  Skin is warm, dry and intact. Psychiatric: Mood and affect are normal. Speech and behavior are normal.  ____________________________________________   LABS (all labs ordered are listed, but only abnormal results are displayed)  Labs Reviewed - No data to display   PROCEDURES  Procedure(s) performed:  Tetracaine was placed to the left eye. Lid was inverted and no foreign body was noted. Fluorescein dye was placed and a corneal abrasion at approximately 2:00 position was noted. Area appears to be  superficial. No foreign body was noted and I was irrigated with eyewash solution. Visual acuity was performed.  Procedures  Critical Care performed: No  ____________________________________________   INITIAL IMPRESSION / ASSESSMENT AND PLAN / ED COURSE Patient was given a prescription for Tobrex ophthalmic solution 2 drops every 4 hours while awake. She is to follow-up with [facility] if any continued problems.  Patient has a history of hypertension and is encouraged to have her blood pressure rechecked.   ____________________________________________   FINAL CLINICAL IMPRESSION(S) / ED DIAGNOSES  Final diagnoses:  Abrasion of left cornea, initial encounter     ED Discharge Orders        Ordered    tobramycin (TOBREX) 0.3 % ophthalmic solution  Every 4 hours     05/29/17 1149       Note:  This document was prepared using Dragon voice recognition software and may include unintentional dictation errors.    Tommi RumpsSummers, Rhonda L, PA-C 05/29/17 [id number]    Summers, Rhonda L, PA-C 05/29/17 Margretta [id number]    Governor RooksLord, Rebecca, MD 06/02/17 [phone]

## 2017-05-29 NOTE — ED Notes (Signed)
See triage note  Developed some irritation to left eye yesterday  Woke up with increased pain and swelling to eye this am  Unsure if she had gotten anything in her eye

## 2017-05-29 NOTE — ED Triage Notes (Signed)
Pt states unsure if she got something in her eye or scratched L eye, c/o pain and redness since yesterday, states woke up this morning with drainage to L eye.

## 2017-05-29 NOTE — Discharge Instructions (Signed)
Follow-up with Dr. Brooke DareKing at [facility] if any continued problems. His contact information is listed on your discharge papers. Begin using Tobrex ophthalmic solution 2 drops every 4 hours while awake to the left eye. You may need to use sunglasses when in bright light.

## 2017-10-31 ENCOUNTER — Emergency Department (HOSPITAL_COMMUNITY)
Admission: EM | Admit: 2017-10-31 | Discharge: 2017-10-31 | Disposition: A | Discharge status: Home / Self Care | Payer: Self-pay | Attending: Emergency Medicine | Admitting: Emergency Medicine

## 2017-10-31 ENCOUNTER — Encounter (HOSPITAL_COMMUNITY): Payer: Self-pay | Admitting: Emergency Medicine

## 2017-10-31 ENCOUNTER — Emergency Department (HOSPITAL_COMMUNITY): Payer: Self-pay

## 2017-10-31 DIAGNOSIS — F17210 Nicotine dependence, cigarettes, uncomplicated: Secondary | ICD-10-CM | POA: Insufficient documentation

## 2017-10-31 DIAGNOSIS — R112 Nausea with vomiting, unspecified: Secondary | ICD-10-CM | POA: Insufficient documentation

## 2017-10-31 DIAGNOSIS — I10 Essential (primary) hypertension: Secondary | ICD-10-CM | POA: Insufficient documentation

## 2017-10-31 DIAGNOSIS — R1012 Left upper quadrant pain: Secondary | ICD-10-CM | POA: Insufficient documentation

## 2017-10-31 LAB — COMPREHENSIVE METABOLIC PANEL
ALT: 25 U/L (ref 14–54)
AST: 25 U/L (ref 15–41)
Albumin: 3.9 g/dL (ref 3.5–5.0)
Alkaline Phosphatase: 87 U/L (ref 38–126)
Anion gap: 12 (ref 5–15)
BUN: 13 mg/dL (ref 6–20)
CO2: 24 mmol/L (ref 22–32)
Calcium: 9.1 mg/dL (ref 8.9–10.3)
Chloride: 99 mmol/L — ABNORMAL LOW (ref 101–111)
Creatinine, Ser: 1.07 mg/dL — ABNORMAL HIGH (ref 0.44–1.00)
GFR calc Af Amer: >60 mL/min (ref >60)
GFR calc non Af Amer: >60 mL/min (ref >60)
Glucose, Bld: 97 mg/dL (ref 65–99)
Potassium: 3.6 mmol/L (ref 3.5–5.1)
Sodium: 135 mmol/L (ref 135–145)
Total Bilirubin: 0.6 mg/dL (ref 0.3–1.2)
Total Protein: 7.3 g/dL (ref 6.5–8.1)

## 2017-10-31 LAB — CBC
HCT: 38.8 % (ref 36.0–46.0)
HEMOGLOBIN: 13 g/dL (ref 12.0–15.0)
MCH: 29.3 pg (ref 26.0–34.0)
MCHC: 33.5 g/dL (ref 30.0–36.0)
MCV: 87.4 fL (ref 78.0–100.0)
PLATELETS: 398 10*3/uL (ref 150–400)
RBC: 4.44 MIL/uL (ref 3.87–5.11)
RDW: 12.8 % (ref 11.5–15.5)
WBC: 4.6 10*3/uL (ref 4.0–10.5)

## 2017-10-31 LAB — URINALYSIS, ROUTINE W REFLEX MICROSCOPIC
Bacteria, UA: NONE SEEN
Bilirubin Urine: NEGATIVE
Glucose, UA: NEGATIVE mg/dL
Ketones, ur: NEGATIVE mg/dL
Leukocytes, UA: NEGATIVE
Nitrite: NEGATIVE
Protein, ur: NEGATIVE mg/dL
Specific Gravity, Urine: 1.008 (ref 1.005–1.030)
pH: 5 (ref 5.0–8.0)

## 2017-10-31 LAB — I-STAT BETA HCG BLOOD, ED (MC, WL, AP ONLY): I-stat hCG, quantitative: <5 m[IU]/mL (ref <5)

## 2017-10-31 LAB — LIPASE, BLOOD: LIPASE: 31 U/L (ref 11–51)

## 2017-10-31 IMAGING — CR DG ABDOMEN ACUTE W/ 1V CHEST
3 series · 3 of 3 positions shown · non-contrast
Comparison: PA and lateral chest [DATE]. CT abdomen and pelvis
[DATE].

CLINICAL DATA: Diarrhea for a few months with recent onset of
vomiting.

EXAM:
DG ABDOMEN ACUTE W/ 1V CHEST

[chest pa]
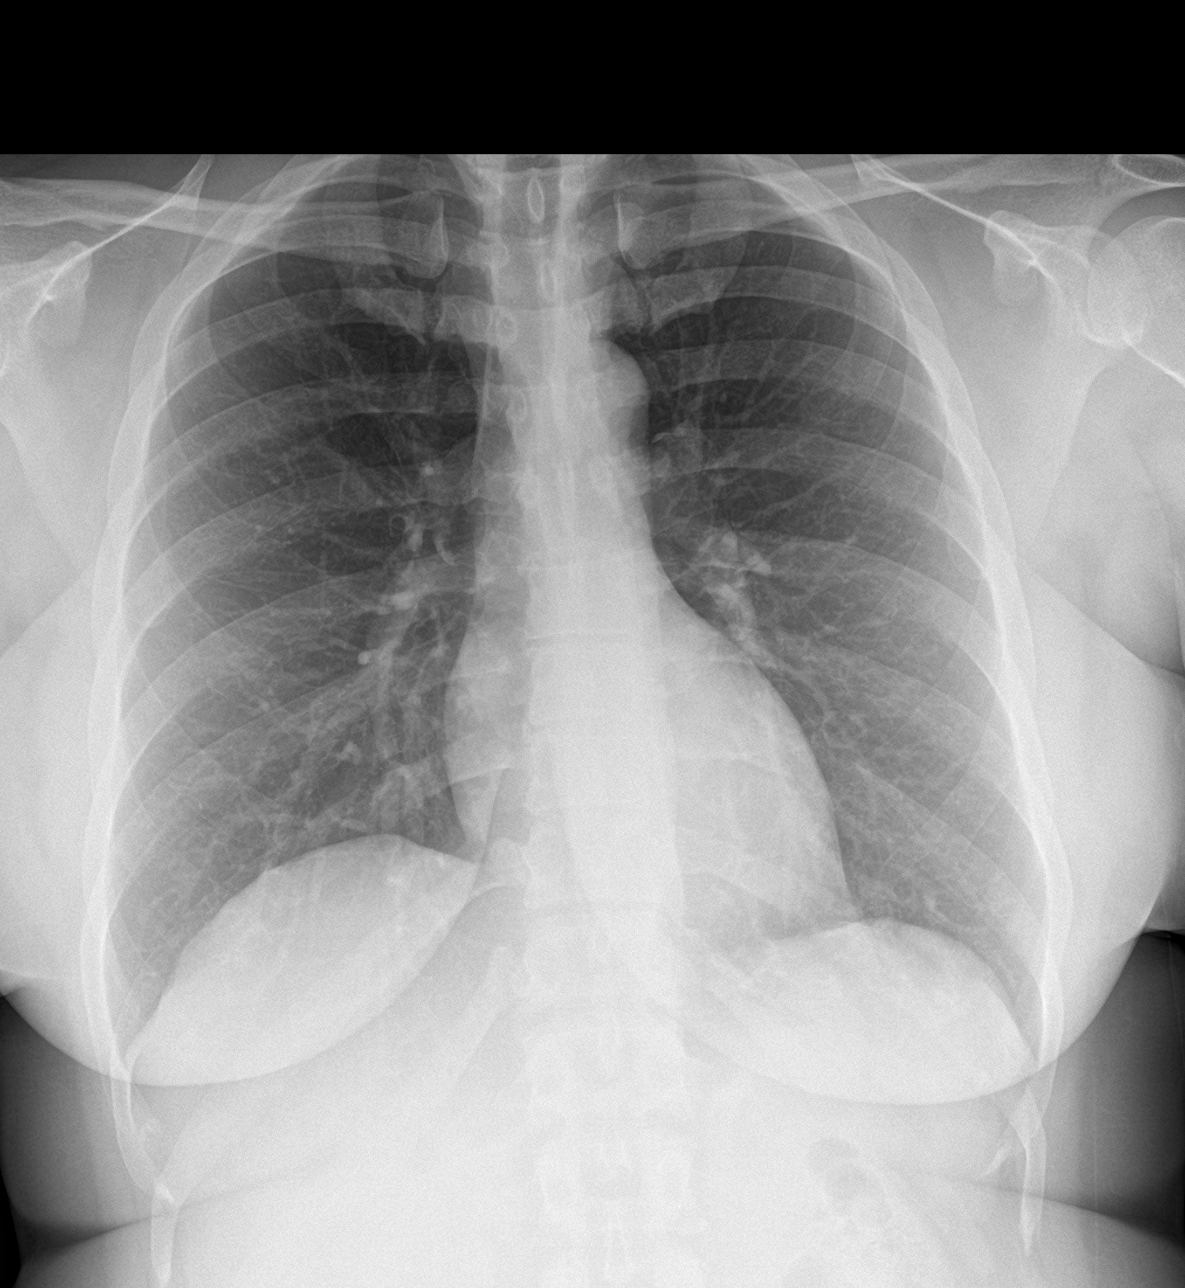

[abdomen supine (1 of 2)]
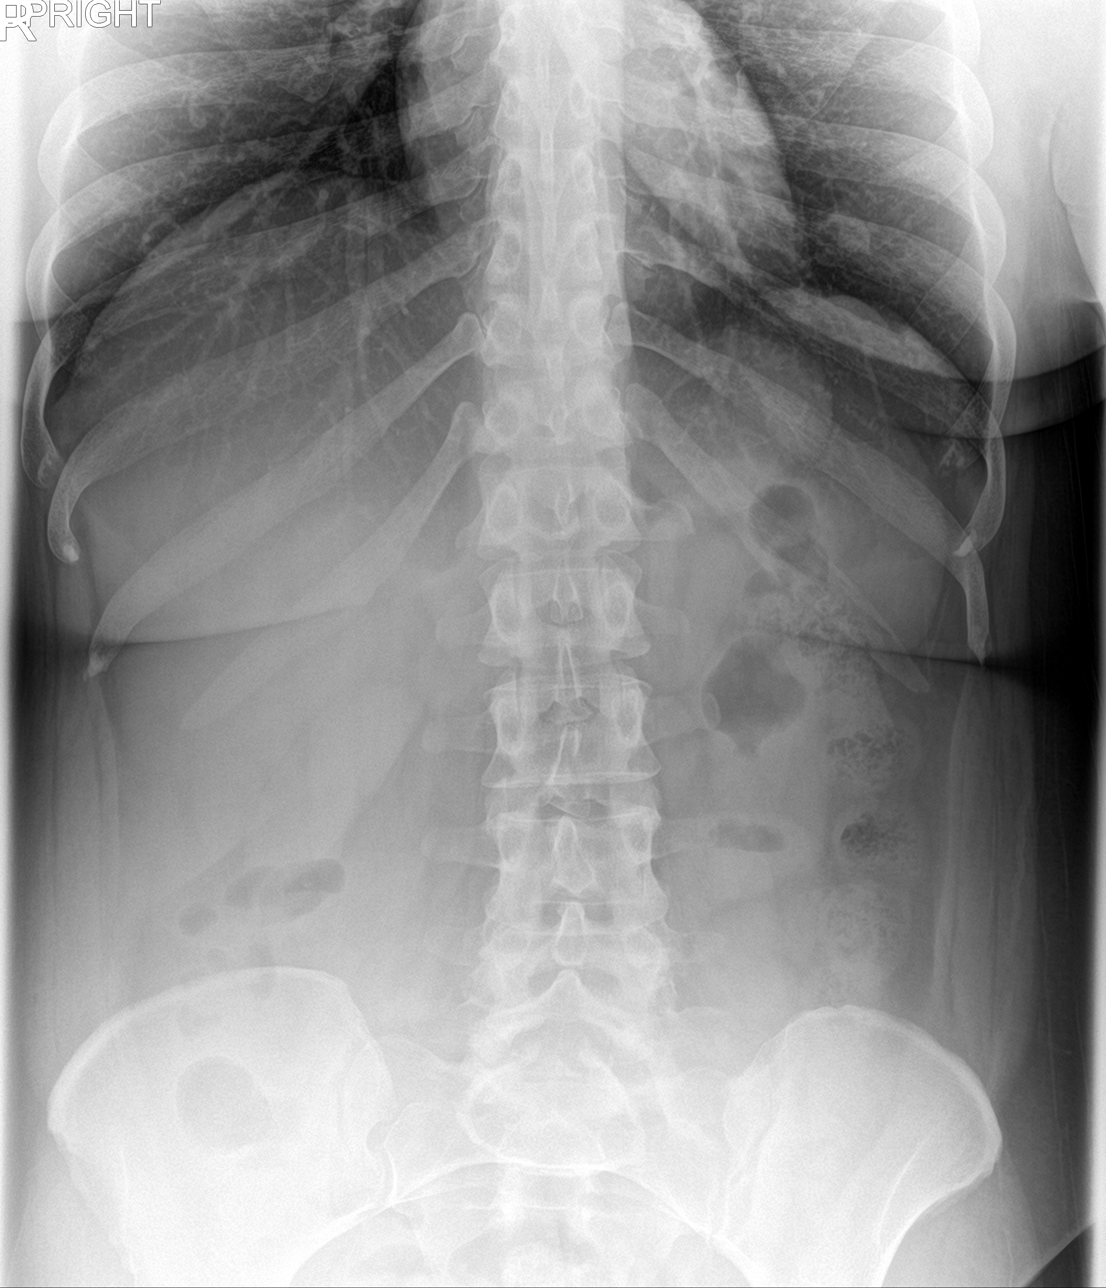

[abdomen supine (2 of 2)]
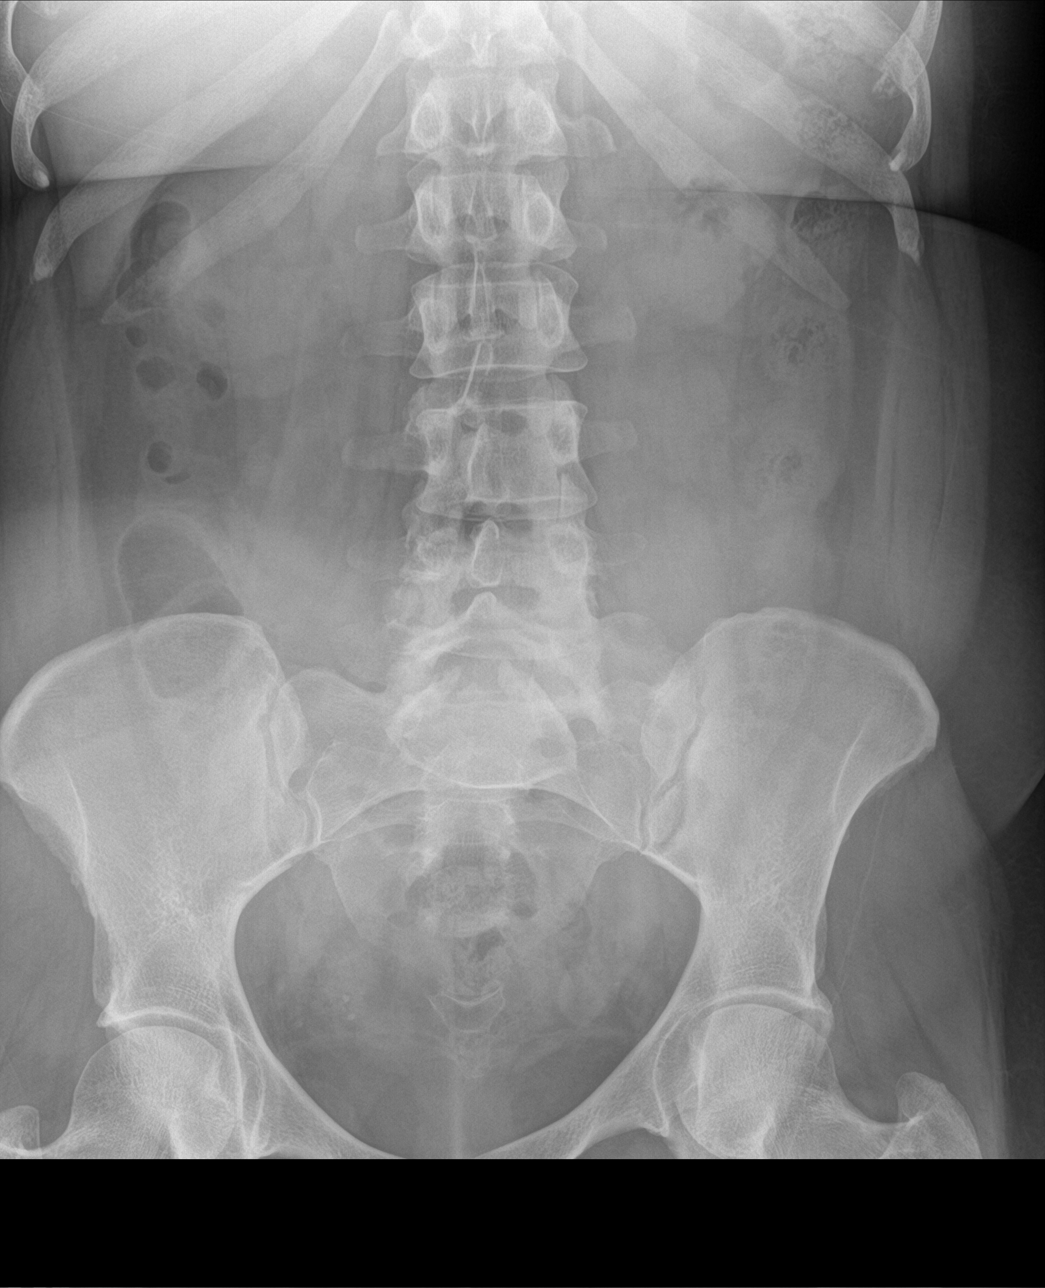

[3 of 3 positions shown; findings below may reference images not displayed]

FINDINGS: Single-view of the chest demonstrates clear lungs and normal heart
size. No pneumothorax or pleural effusion.

Two views of the abdomen show a normal bowel gas pattern. Stool
burden is unremarkable. No abnormal abdominal calcification or focal
bony abnormality. Transitional lumbosacral anatomy incidentally
noted.
IMPRESSION: No acute abnormality chest or abdomen.

## 2017-10-31 MED ORDER — SUCRALFATE 1 G PO TABS: 1.0000 g | ORAL_TABLET | Freq: Three times a day (TID) | ORAL | 0 refills | Status: AC

## 2017-10-31 MED ORDER — PANTOPRAZOLE SODIUM 20 MG PO TBEC: 20.0000 mg | DELAYED_RELEASE_TABLET | Freq: Every day | ORAL | 0 refills | Status: AC

## 2017-10-31 MED ORDER — ONDANSETRON 4 MG PO TBDP: 4.0000 mg | ORAL_TABLET | Freq: Three times a day (TID) | ORAL | 0 refills | Status: DC | PRN

## 2017-10-31 NOTE — ED Provider Notes (Signed)
Emergency Department Provider Note   I have reviewed the triage vital signs and the nursing notes.   HISTORY  Chief Complaint Emesis   HPI Roberta Hicks is a 42 y.o. female with PMH of HTN presents to the emergency department for evaluation of intermittent left-sided abdominal/flank pain with nausea.  Symptoms began approximately 6 days ago while eating solid food.  Patient had a sensation of fullness in her epigastric region and began having some left upper quadrant abdominal pain.  She felt nausea and eventually had to induce vomiting to feel better.  She went several days without eating and then went to a barbecue this weekend where symptoms returned.  She is able to drink fluids.  She has no history of esophageal stricture or known hiatal hernia.  No past surgical history.  She denies any fevers or chills.  No pain into the chest or shortness of breath.    Past Medical History:  Diagnosis Date  . Hypertension   . Migraines     There are no active problems to display for this patient.   Past Surgical History:  Procedure Laterality Date  . TUBAL LIGATION      Current Outpatient Rx  . Order #: [id number] Class: Print  . Order #: [id number] Class: Print  . Order #: [id number] Class: Print  . Order #: [id number] Class: Print  . Order #: [id number] Class: Print  . Order #: [id number] Class: Historical Med    Allergies Patient has no known allergies.  No family history on file.  Social History Social History   Tobacco Use  . Smoking status: Current Every Day Smoker    Packs/day: 0.10    Types: Cigarettes  . Smokeless tobacco: Never Used  Substance Use Topics  . Alcohol use: Yes    Alcohol/week: 0.6 oz    Types: 1 Standard drinks or equivalent per week  . Drug use: No    Review of Systems  Constitutional: No fever/chills Eyes: No visual changes. ENT: No sore throat. Cardiovascular: Denies chest pain. Respiratory: Denies shortness of breath. Gastrointestinal:  Positive epigastric and LUQ abdominal pain.  Positive intermittent nausea and  vomiting.  No diarrhea.  No constipation. Genitourinary: Negative for dysuria. Musculoskeletal: Negative for back pain. Skin: Negative for rash. Neurological: Negative for headaches, focal weakness or numbness.  10-point ROS otherwise negative.  ____________________________________________   PHYSICAL EXAM:  VITAL SIGNS: ED Triage Vitals  Enc Vitals Group     BP 10/31/17 0912 115/70     Pulse Rate 10/31/17 0912 97     Resp 10/31/17 0912 16     Temp 10/31/17 0912 97.9 F (36.6 C)     Temp Source 10/31/17 0912 Oral     SpO2 10/31/17 0912 100 %     Pain Score 10/31/17 0917 0   Constitutional: Alert and oriented. Well appearing and in no acute distress. Eyes: Conjunctivae are normal.  Nose: No congestion/rhinnorhea. Mouth/Throat: Mucous membranes are moist.  Neck: No stridor.   Cardiovascular: Normal rate, regular rhythm. Good peripheral circulation. Grossly normal heart sounds.   Respiratory: Normal respiratory effort.  No retractions. Lungs CTAB. Gastrointestinal: Soft and nontender. Negative Murphy's sign. No distention.  Musculoskeletal: No lower extremity tenderness nor edema. No gross deformities of extremities. Neurologic:  Normal speech and language. No gross focal neurologic deficits are appreciated.  Skin:  Skin is warm, dry and intact. No rash noted.  ____________________________________________   LABS (all labs ordered are listed, but only abnormal results are displayed)  Labs Reviewed  COMPREHENSIVE  METABOLIC PANEL - Abnormal; Notable for the following components:      Result Value   Chloride 99 (*)    Creatinine, Ser 1.07 (*)    All other components within normal limits  URINALYSIS, ROUTINE W REFLEX MICROSCOPIC - Abnormal; Notable for the following components:   Color, Urine STRAW (*)    Hgb urine dipstick SMALL (*)    All other components within normal limits  LIPASE, BLOOD    CBC  I-STAT BETA HCG BLOOD, ED ([facility], [facility], [facility] ONLY)   ____________________________________________  RADIOLOGY  Dg Abdomen Acute W/chest  Result Date: 10/31/2017 CLINICAL DATA:  Diarrhea for a few months with recent onset of vomiting. EXAM: DG ABDOMEN ACUTE W/ 1V CHEST COMPARISON:  PA and lateral chest 09/11/2015. CT abdomen and pelvis 12/24/2014. FINDINGS: Single-view of the chest demonstrates clear lungs and normal heart size. No pneumothorax or pleural effusion. Two views of the abdomen show a normal bowel gas pattern. Stool burden is unremarkable. No abnormal abdominal calcification or focal bony abnormality. Transitional lumbosacral anatomy incidentally noted. IMPRESSION: No acute abnormality chest or abdomen. Electronically Signed   By: Drusilla Kanner M.D.   On: 10/31/2017 15:41    ____________________________________________   PROCEDURES  Procedure(s) performed:   Procedures  None ____________________________________________   INITIAL IMPRESSION / ASSESSMENT AND PLAN / ED COURSE  Pertinent labs & imaging results that were available during my care of the patient were reviewed by me and considered in my medical decision making (see chart for details).  Patient presents to the emergency department for evaluation of intermittent left upper quadrant abdominal pain with nausea.  Patient has a sensation that food is getting "stuck" in the lower chest/epigastric region and then feels the need to induce vomiting to feel better.  She is tolerating fluids.  They she is been able to eat pepperoni and a honey bun.  No concern for esophageal food impaction. Extremely low suspicion for acute coronary syndrome.  Plan for plain film of the abdomen and chest to evaluate for large hiatal hernia vs SBO but low suspicion for the latter. If negative, plan for symptomatic meds for home and GI follow up for upper endoscopy. Discussed this plan with the patient who is in agreement with the plan. No  nausea or pain at this time.   Plain film reviewed with no acute findings. No nausea or severe pain here. Plan for supportive care at home and GI follow up.   At this time, I do not feel there is any life-threatening condition present. I have reviewed and discussed all results (EKG, imaging, lab, urine as appropriate), exam findings with patient. I have reviewed nursing notes and appropriate previous records.  I feel the patient is safe to be discharged home without further emergent workup. Discussed usual and customary return precautions. Patient and family (if present) verbalize understanding and are comfortable with this plan.  Patient will follow-up with their primary care provider. If they do not have a primary care provider, information for follow-up has been provided to them. All questions have been answered.  ____________________________________________  FINAL CLINICAL IMPRESSION(S) / ED DIAGNOSES  Final diagnoses:  Non-intractable vomiting with nausea, unspecified vomiting type  LUQ abdominal pain    NEW OUTPATIENT MEDICATIONS STARTED DURING THIS VISIT:  Discharge Medication List as of 10/31/2017  3:48 PM    START taking these medications   Details  ondansetron (ZOFRAN ODT) 4 MG disintegrating tablet Take 1 tablet (4 mg total) by mouth every 8 (eight) hours  as needed for nausea or vomiting., Starting Mon 10/31/2017, Print    pantoprazole (PROTONIX) 20 MG tablet Take 1 tablet (20 mg total) by mouth daily., Starting Mon 10/31/2017, Until Wed 11/30/2017, Print    sucralfate (CARAFATE) 1 g tablet Take 1 tablet (1 g total) by mouth 4 (four) times daily -  with meals and at bedtime for 14 days., Starting Mon 10/31/2017, Until Mon 11/14/2017, Print        Note:  This document was prepared using Dragon voice recognition software and may include unintentional dictation errors.  Alona Bene, MD Emergency Medicine    Anielle Headrick, Arlyss Repress, MD 10/31/17 Windell Moment

## 2017-10-31 NOTE — Discharge Instructions (Signed)
You have been seen in the Emergency Department (ED) for abdominal pain.  Your evaluation did not identify a clear cause of your symptoms but was generally reassuring.  You will need to call the Gastroenterologist to schedule an appointment and continue the evaluation of your pain symptoms. You may need an upper endoscopy for further evaluation. Take the medications as prescribed.   Please follow up as instructed above regarding today?s emergent visit and the symptoms that are bothering you.  Return to the ED if your abdominal pain worsens or fails to improve, you develop bloody vomiting, bloody diarrhea, you are unable to tolerate fluids due to vomiting, fever greater than 101, or other symptoms that concern you.

## 2017-10-31 NOTE — ED Triage Notes (Signed)
Pt reports she is unable to keep any food down, states when she vomits, her food comes back up whole. Last bm was this am, states it was diarrhea which she has had for the past few months. Symptoms started last thursday. resp e/u, nad.

## 2018-04-02 ENCOUNTER — Emergency Department: Payer: Medicaid Other

## 2018-04-02 ENCOUNTER — Other Ambulatory Visit: Payer: Self-pay

## 2018-04-02 ENCOUNTER — Emergency Department
Admission: EM | Admit: 2018-04-02 | Discharge: 2018-04-02 | Disposition: A | Discharge status: Home / Self Care | Payer: Medicaid Other | Attending: Emergency Medicine | Admitting: Emergency Medicine

## 2018-04-02 DIAGNOSIS — Z79899 Other long term (current) drug therapy: Secondary | ICD-10-CM | POA: Insufficient documentation

## 2018-04-02 DIAGNOSIS — J40 Bronchitis, not specified as acute or chronic: Secondary | ICD-10-CM | POA: Insufficient documentation

## 2018-04-02 DIAGNOSIS — I10 Essential (primary) hypertension: Secondary | ICD-10-CM | POA: Insufficient documentation

## 2018-04-02 DIAGNOSIS — F17210 Nicotine dependence, cigarettes, uncomplicated: Secondary | ICD-10-CM | POA: Insufficient documentation

## 2018-04-02 LAB — CBC
HEMATOCRIT: 35.6 % — AB (ref 36.0–46.0)
HEMOGLOBIN: 11.9 g/dL — AB (ref 12.0–15.0)
MCH: 29.2 pg (ref 26.0–34.0)
MCHC: 33.4 g/dL (ref 30.0–36.0)
MCV: 87.3 fL (ref 80.0–100.0)
NRBC: 0 % (ref 0.0–0.2)
PLATELETS: 353 10*3/uL (ref 150–400)
RBC: 4.08 MIL/uL (ref 3.87–5.11)
RDW: 12.7 % (ref 11.5–15.5)
WBC: 8.1 10*3/uL (ref 4.0–10.5)

## 2018-04-02 LAB — BASIC METABOLIC PANEL
Anion gap: 8 (ref 5–15)
BUN: 7 mg/dL (ref 6–20)
CO2: 24 mmol/L (ref 22–32)
Calcium: 8.7 mg/dL — ABNORMAL LOW (ref 8.9–10.3)
Chloride: 104 mmol/L (ref 98–111)
Creatinine, Ser: 0.71 mg/dL (ref 0.44–1.00)
GFR calc Af Amer: >60 mL/min (ref >60)
GLUCOSE: 95 mg/dL (ref 70–99)
Potassium: 3.4 mmol/L — ABNORMAL LOW (ref 3.5–5.1)
Sodium: 136 mmol/L (ref 135–145)

## 2018-04-02 LAB — TROPONIN I

## 2018-04-02 IMAGING — CR DG CHEST 2V
2 series · 2 of 2 positions shown · non-contrast
Comparison: [DATE], [DATE]

CLINICAL DATA: 42-year-old female with a history of chronic
bronchitis

EXAM:
CHEST - 2 VIEW

[chest pa]
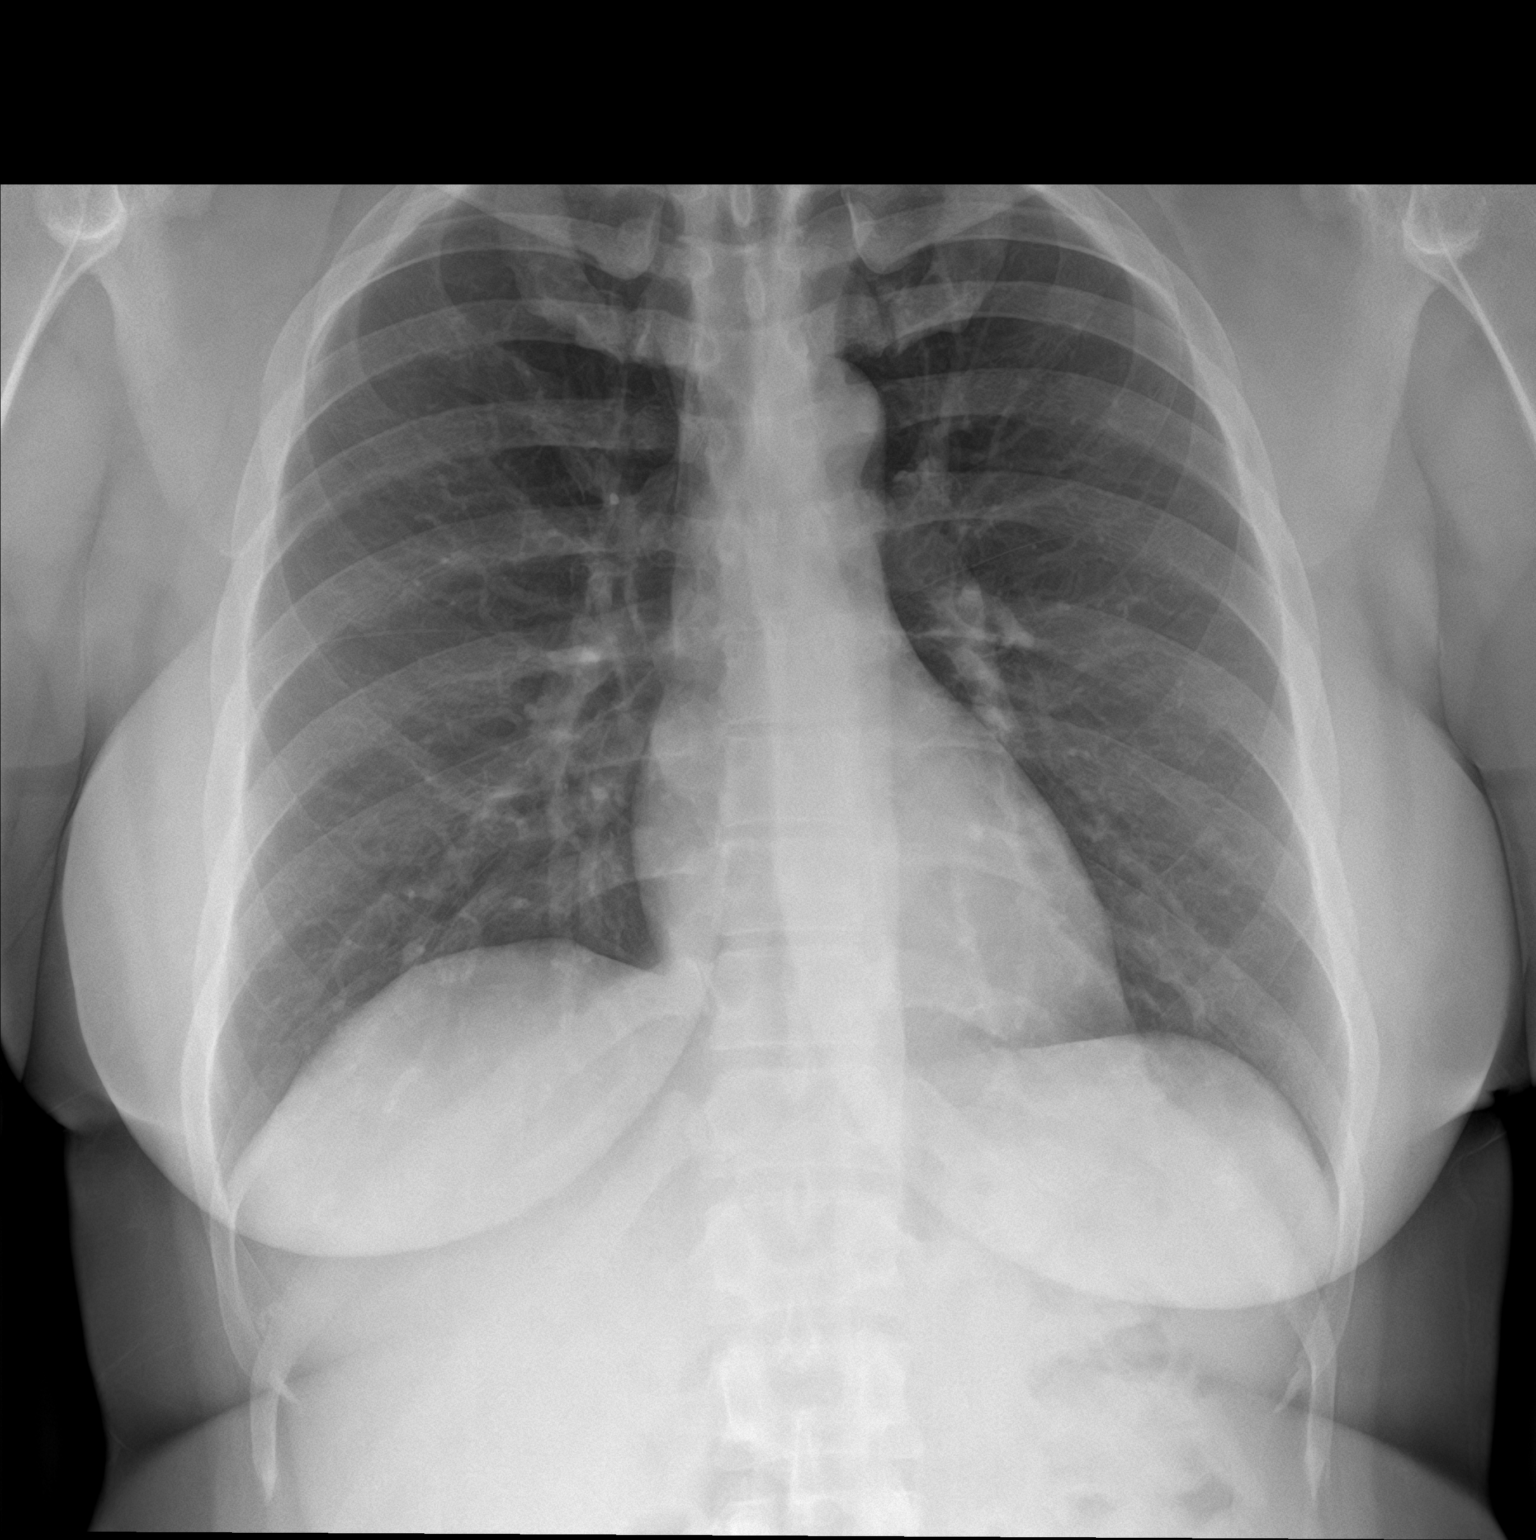

[chest lat]
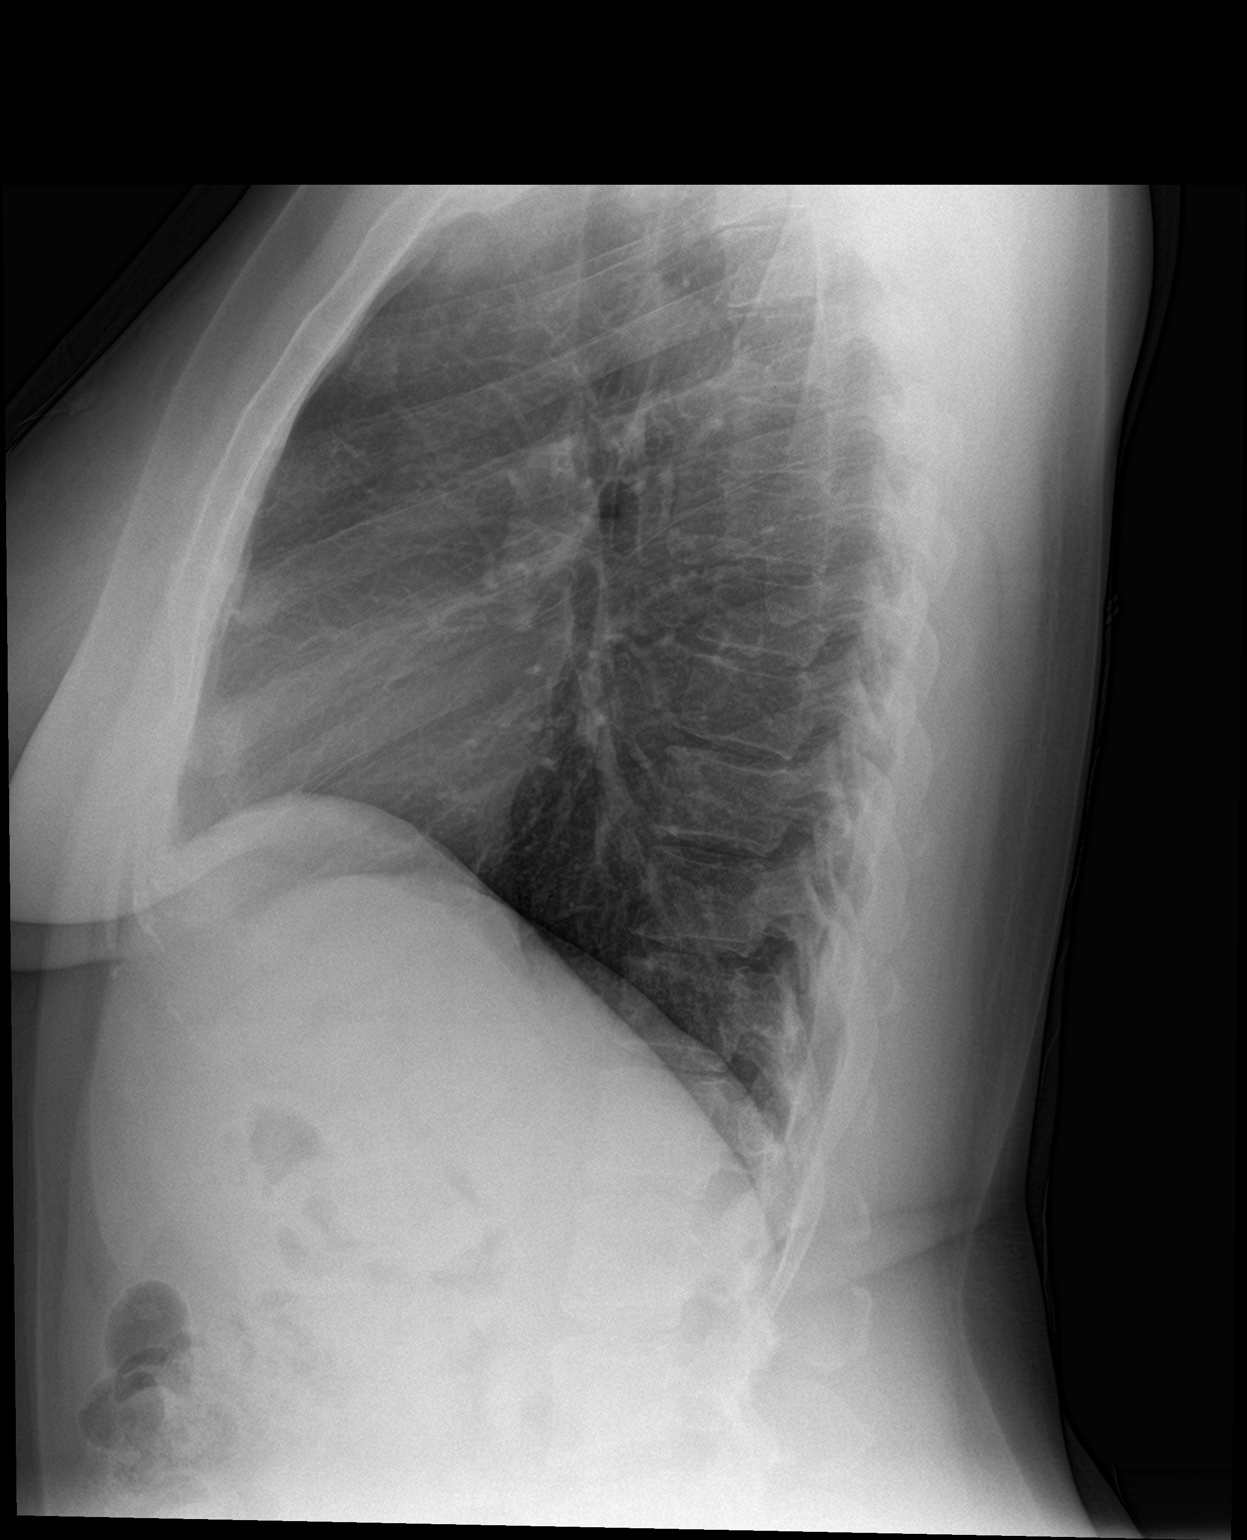

[2 of 2 positions shown; findings below may reference images not displayed]

FINDINGS: Cardiomediastinal silhouette unchanged in size and contour. No
evidence of central vascular congestion. No pneumothorax or pleural
effusion. No confluent airspace disease.

No displaced fracture.
IMPRESSION: Negative for acute cardiopulmonary disease.

## 2018-04-02 MED ORDER — IPRATROPIUM-ALBUTEROL 0.5-2.5 (3) MG/3ML IN SOLN
3.0000 mL | Freq: Once | RESPIRATORY_TRACT | Status: AC
  Administered 2018-04-02: 3 mL via RESPIRATORY_TRACT

## 2018-04-02 MED ORDER — PREDNISONE 20 MG PO TABS
50.0000 mg | ORAL_TABLET | Freq: Once | ORAL | Status: AC
  Administered 2018-04-02: 50 mg via ORAL

## 2018-04-02 MED ORDER — IPRATROPIUM-ALBUTEROL 0.5-2.5 (3) MG/3ML IN SOLN
RESPIRATORY_TRACT | Status: AC
  Administered 2018-04-02: 3 mL via RESPIRATORY_TRACT
  Filled 2018-04-02: qty 3

## 2018-04-02 MED ORDER — PREDNISONE 10 MG PO TABS
ORAL_TABLET | ORAL | Status: AC
  Filled 2018-04-02: qty 5

## 2018-04-02 MED ORDER — PREDNISONE 50 MG PO TABS: 50.0000 mg | ORAL_TABLET | Freq: Every day | ORAL | 0 refills | Status: DC

## 2018-04-02 NOTE — ED Provider Notes (Addendum)
[facility] Emergency Department Provider Note   ____________________________________________    I have reviewed the triage vital signs and the nursing notes.   HISTORY  Chief Complaint Shortness of Breath     HPI Roberta Hicks is a 42 y.o. female who presents with complaints of shortness of breath and cough which is been going on for several days.  She reports a history of chronic bronchitis.  She does smoke cigarettes.  She denies fevers or chills.  Reports productive cough and very mild shortness of breath.  No calf pain or swelling.  No nausea vomiting or diaphoresis.  No chest pain.  Past Medical History:  Diagnosis Date  . Hypertension   . Migraines     There are no active problems to display for this patient.   Past Surgical History:  Procedure Laterality Date  . TUBAL LIGATION      Prior to Admission medications   Medication Sig Start Date End Date Taking? Authorizing Provider  hydrocortisone cream 0.5 % Apply 1 application topically 2 (two) times daily. 02/20/15   Beers, Charmayne Sheer, PA-C  ondansetron (ZOFRAN ODT) 4 MG disintegrating tablet Take 1 tablet (4 mg total) by mouth every 8 (eight) hours as needed for nausea or vomiting. 10/31/17   Long, Arlyss Repress, MD  pantoprazole (PROTONIX) 20 MG tablet Take 1 tablet (20 mg total) by mouth daily. 10/31/17 11/30/17  Long, Arlyss Repress, MD  predniSONE (DELTASONE) 50 MG tablet Take 1 tablet (50 mg total) by mouth daily with breakfast. 04/02/18   Jene Every, MD  sucralfate (CARAFATE) 1 g tablet Take 1 tablet (1 g total) by mouth 4 (four) times daily -  with meals and at bedtime for 14 days. 10/31/17 11/14/17  Long, Arlyss Repress, MD  tobramycin (TOBREX) 0.3 % ophthalmic solution Place 2 drops into the left eye every 4 (four) hours. While awake 05/29/17   Tommi Rumps, PA-C  venlafaxine XR (EFFEXOR-XR) 150 MG 24 hr capsule Take 150 mg by mouth daily with breakfast.    [provider]      Allergies Patient has no known allergies.  History reviewed. No pertinent family history.  Social History Social History   Tobacco Use  . Smoking status: Current Every Day Smoker    Packs/day: 0.10    Types: Cigarettes  . Smokeless tobacco: Never Used  Substance Use Topics  . Alcohol use: Yes    Alcohol/week: 1.0 standard drinks    Types: 1 Standard drinks or equivalent per week  . Drug use: No    Review of Systems  Constitutional: No fever/chills  ENT: No sore throat. Respiratory: As above Gastrointestinal: No abdominal pain.  No nausea, no vomiting.    Musculoskeletal: Negative for calf pain Skin: Negative for rash. Neurological: Negative for headaches     ____________________________________________   PHYSICAL EXAM:  VITAL SIGNS: ED Triage Vitals [04/02/18 1502]  Enc Vitals Group     BP (!) 173/138     Pulse Rate 97     Resp 20     Temp 98.7 F (37.1 C)     Temp Source Oral     SpO2 99 %     Weight 84.4 kg (186 lb)     Height 1.702 m (5\' 7" )     Head Circumference      Peak Flow      Pain Score 0     Pain Loc      Pain Edu?  Excl. in GC?      Constitutional: Alert and oriented. No acute distress. Pleasant and interactive   Nose: No congestion/rhinnorhea. Mouth/Throat: Mucous membranes are moist.   Cardiovascular: Normal rate, regular rhythm.  Respiratory: Normal respiratory effort.  No retractions.  Scattered very mild wheezes Genitourinary: deferred Musculoskeletal: No lower extremity tenderness nor edema.   Neurologic:  Normal speech and language. No gross focal neurologic deficits are appreciated.   Skin:  Skin is warm, dry and intact. No rash noted.   ____________________________________________   LABS (all labs ordered are listed, but only abnormal results are displayed)  Labs Reviewed  BASIC METABOLIC PANEL - Abnormal; Notable for the following components:      Result Value   Potassium 3.4 (*)    Calcium 8.7 (*)    All  other components within normal limits  CBC - Abnormal; Notable for the following components:   Hemoglobin 11.9 (*)    HCT 35.6 (*)    All other components within normal limits  TROPONIN I  POC URINE PREG, ED   ____________________________________________  EKG  ED ECG REPORT I, Jene Every, the attending physician, personally viewed and interpreted this ECG.  Date: 04/13/2018  Rhythm: normal sinus rhythm QRS Axis: normal Intervals: normal ST/T Wave abnormalities: normal Narrative Interpretation: no evidence of acute ischemia  ____________________________________________  RADIOLOGY  Chest x-ray unremarkable ____________________________________________   PROCEDURES  Procedure(s) performed: No  Procedures   Critical Care performed: No ____________________________________________   INITIAL IMPRESSION / ASSESSMENT AND PLAN / ED COURSE  Pertinent labs & imaging results that were available during my care of the patient were reviewed by me and considered in my medical decision making (see chart for details).  Patient treated with DuoNeb, prednisone 50 mg for mild bronchospasm related to bronchitis.  Felt significant better after DuoNeb.  Will discharge with 5 days prednisone, strict return precautions discussed   ____________________________________________   FINAL CLINICAL IMPRESSION(S) / ED DIAGNOSES  Final diagnoses:  Bronchitis      NEW MEDICATIONS STARTED DURING THIS VISIT:  Discharge Medication List as of 04/02/2018  4:59 PM    START taking these medications   Details  predniSONE (DELTASONE) 50 MG tablet Take 1 tablet (50 mg total) by mouth daily with breakfast., Starting Sun 04/02/2018, Print         Note:  This document was prepared using Dragon voice recognition software and may include unintentional dictation errors.    Jene Every, MD 04/02/18 Clarisa Fling    Jene Every, MD 04/13/18 [phone]

## 2018-04-02 NOTE — ED Triage Notes (Signed)
Pt arrives to ED with chronic bronchitis. States since 2010. States about a week ago flared up again. Very hoarse voice. States SOB. Mask in place. Denies fever. A&O x4. Ambulatory. Cough present.

## 2018-04-02 NOTE — ED Notes (Signed)
NAD noted at time of D/C. Pt denies questions or concerns. Pt ambulatory to the lobby at this time.  

## 2020-11-23 ENCOUNTER — Emergency Department
Admission: EM | Admit: 2020-11-23 | Discharge: 2020-11-23 | Disposition: A | Discharge status: Home / Self Care | Payer: Self-pay | Attending: Emergency Medicine | Admitting: Emergency Medicine

## 2020-11-23 ENCOUNTER — Other Ambulatory Visit: Payer: Self-pay

## 2020-11-23 ENCOUNTER — Emergency Department: Payer: Self-pay

## 2020-11-23 ENCOUNTER — Encounter: Payer: Self-pay | Admitting: Emergency Medicine

## 2020-11-23 DIAGNOSIS — G4459 Other complicated headache syndrome: Secondary | ICD-10-CM | POA: Insufficient documentation

## 2020-11-23 DIAGNOSIS — F17210 Nicotine dependence, cigarettes, uncomplicated: Secondary | ICD-10-CM | POA: Insufficient documentation

## 2020-11-23 DIAGNOSIS — G43109 Migraine with aura, not intractable, without status migrainosus: Secondary | ICD-10-CM

## 2020-11-23 DIAGNOSIS — R202 Paresthesia of skin: Secondary | ICD-10-CM | POA: Insufficient documentation

## 2020-11-23 DIAGNOSIS — G5603 Carpal tunnel syndrome, bilateral upper limbs: Secondary | ICD-10-CM | POA: Insufficient documentation

## 2020-11-23 DIAGNOSIS — I10 Essential (primary) hypertension: Secondary | ICD-10-CM | POA: Insufficient documentation

## 2020-11-23 LAB — CBC
HCT: 37.1 % (ref 36.0–46.0)
Hemoglobin: 12.2 g/dL (ref 12.0–15.0)
MCH: 28.6 pg (ref 26.0–34.0)
MCHC: 32.9 g/dL (ref 30.0–36.0)
MCV: 87.1 fL (ref 80.0–100.0)
Platelets: 413 10*3/uL — ABNORMAL HIGH (ref 150–400)
RBC: 4.26 MIL/uL (ref 3.87–5.11)
RDW: 13.1 % (ref 11.5–15.5)
WBC: 7.5 10*3/uL (ref 4.0–10.5)
nRBC: 0 % (ref 0.0–0.2)

## 2020-11-23 LAB — DIFFERENTIAL
Abs Immature Granulocytes: 0.01 10*3/uL (ref 0.00–0.07)
Basophils Absolute: 0 10*3/uL (ref 0.0–0.1)
Basophils Relative: 0 %
Eosinophils Absolute: 0.4 10*3/uL (ref 0.0–0.5)
Eosinophils Relative: 5 %
Immature Granulocytes: 0 %
Lymphocytes Relative: 57 %
Lymphs Abs: 4.2 10*3/uL — ABNORMAL HIGH (ref 0.7–4.0)
Monocytes Absolute: 0.5 10*3/uL (ref 0.1–1.0)
Monocytes Relative: 7 %
Neutro Abs: 2.4 10*3/uL (ref 1.7–7.7)
Neutrophils Relative %: 31 %

## 2020-11-23 LAB — COMPREHENSIVE METABOLIC PANEL
ALT: 32 U/L (ref 0–44)
AST: 31 U/L (ref 15–41)
Albumin: 4.2 g/dL (ref 3.5–5.0)
Alkaline Phosphatase: 89 U/L (ref 38–126)
Anion gap: 8 (ref 5–15)
BUN: 16 mg/dL (ref 6–20)
CO2: 27 mmol/L (ref 22–32)
Calcium: 9.5 mg/dL (ref 8.9–10.3)
Chloride: 100 mmol/L (ref 98–111)
Creatinine, Ser: 0.77 mg/dL (ref 0.44–1.00)
GFR, Estimated: >60 mL/min (ref >60)
Glucose, Bld: 108 mg/dL — ABNORMAL HIGH (ref 70–99)
Potassium: 3.3 mmol/L — ABNORMAL LOW (ref 3.5–5.1)
Sodium: 135 mmol/L (ref 135–145)
Total Bilirubin: 0.5 mg/dL (ref 0.3–1.2)
Total Protein: 7.7 g/dL (ref 6.5–8.1)

## 2020-11-23 LAB — APTT: aPTT: 28 seconds (ref 24–36)

## 2020-11-23 LAB — PROTIME-INR
INR: 1 (ref 0.8–1.2)
Prothrombin Time: 13.1 seconds (ref 11.4–15.2)

## 2020-11-23 LAB — CBG MONITORING, ED: Glucose-Capillary: 121 mg/dL — ABNORMAL HIGH (ref 70–99)

## 2020-11-23 IMAGING — CT CT HEAD W/O CM
4 series · 17 of 47 positions shown, 19 images · non-contrast
Comparison: [DATE]

CLINICAL DATA: Facial palsy, bilateral arm numbness

EXAM:
CT HEAD WITHOUT CONTRAST
TECHNIQUE: Contiguous axial images were obtained from the base of the skull
through the vertex without intravenous contrast.

[Series 2: head wo · axial · 0.43mm/px · z∈[-86,+34]mm · 7 of 33 slices shown, 9 images]
[im 5/33  brain]
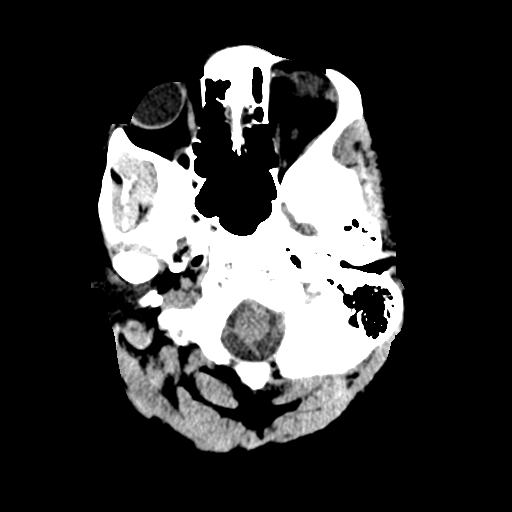
[im 5/33  bone]
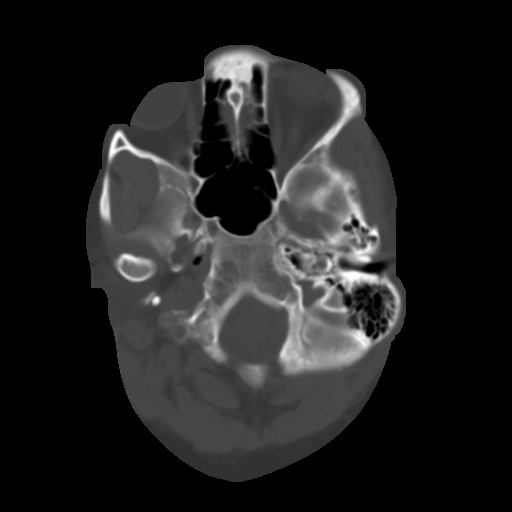
[im 9/33  brain]
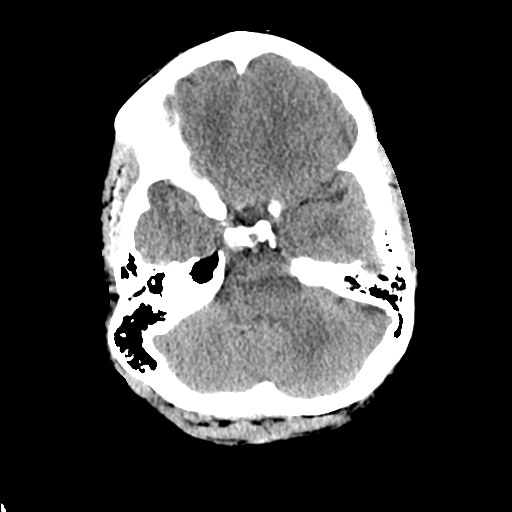
[im 13/33  brain]
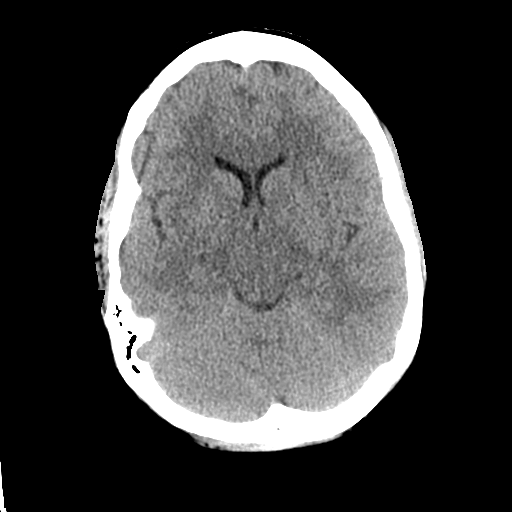
[im 17/33  brain]
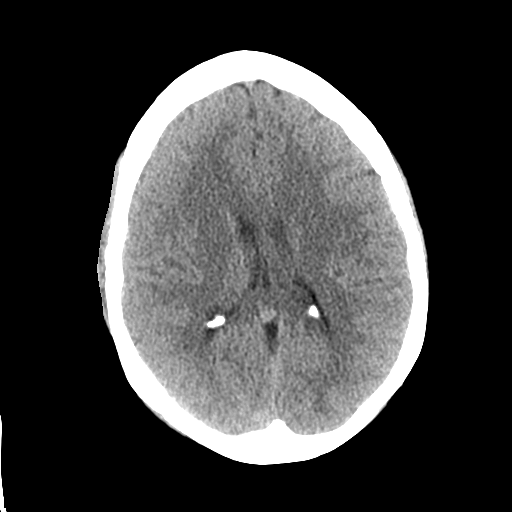
[im 21/33  brain]
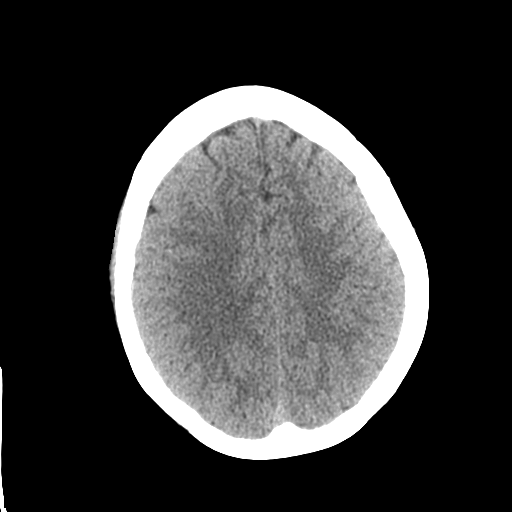
[im 21/33  bone]
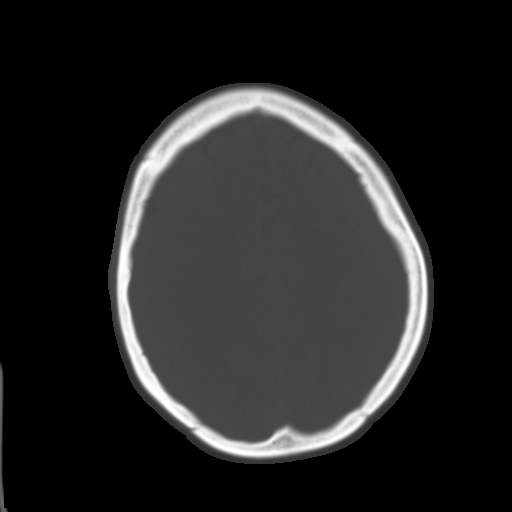
[im 25/33  brain]
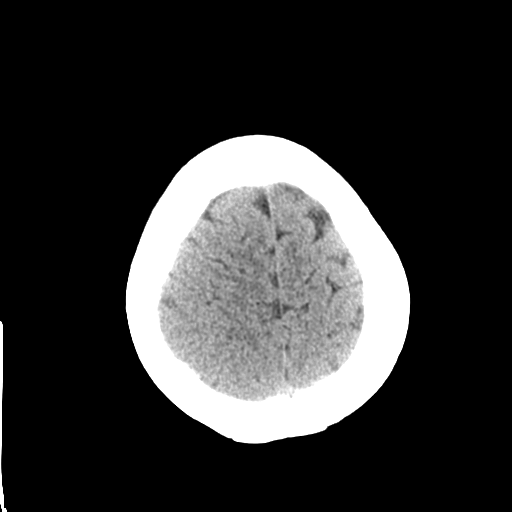
[im 29/33  brain]
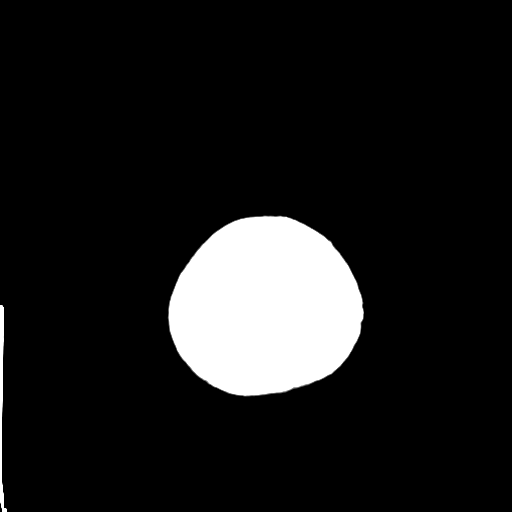

[Series 3: head bone · axial · 0.43mm/px · z∈[-90,-34]mm · 4 of 82 slices shown]
[im 9/82  bone]
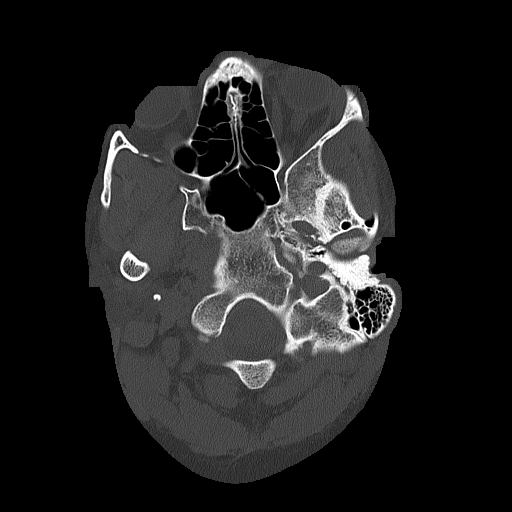
[im 17/82  bone]
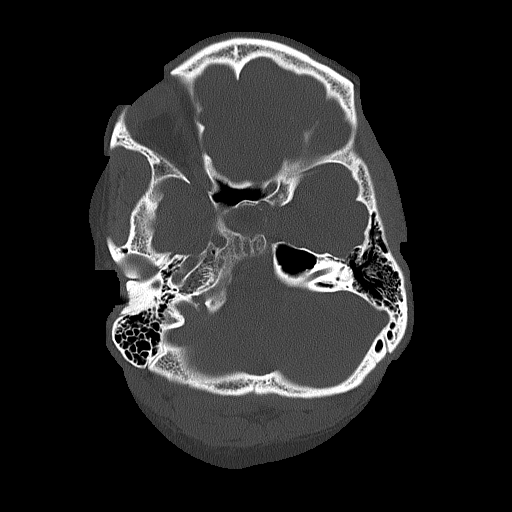
[im 25/82  bone]
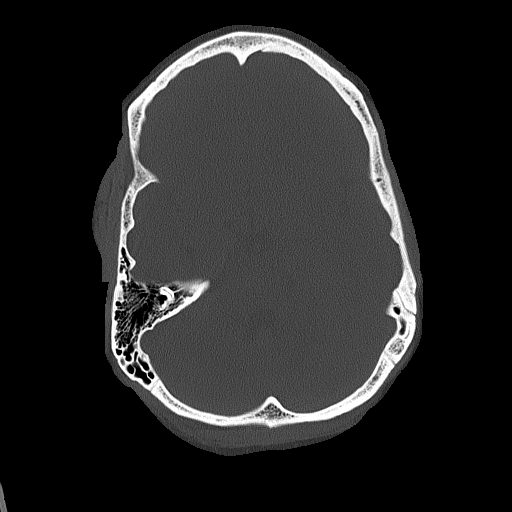
[im 37/82  bone]
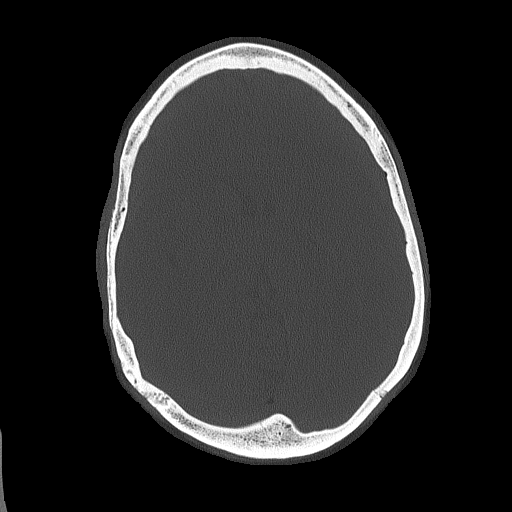

[Series 4: coronal soft tissue · coronal · 0.35mm/px · 3 of 69 slices shown]
[im 23/69  brain]
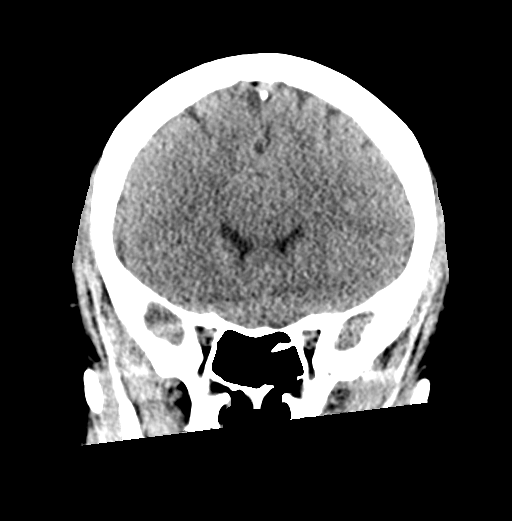
[im 31/69  brain]
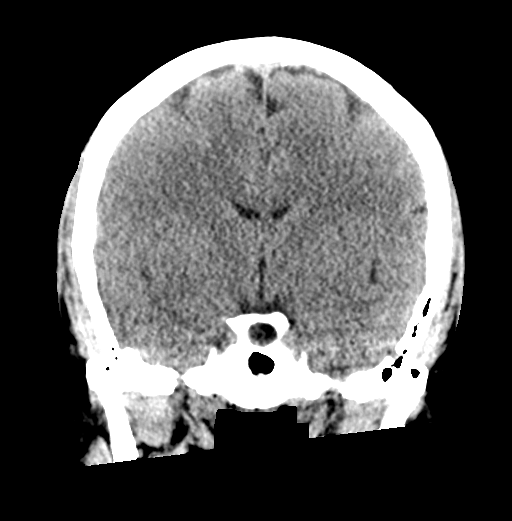
[im 38/69  brain]
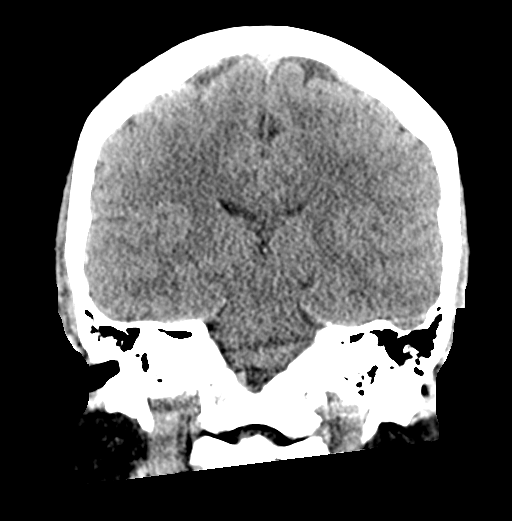

[Series 5: sagittal soft tissue · sagittal · 0.35mm/px · 3 of 60 slices shown]
[im 20/60  brain]
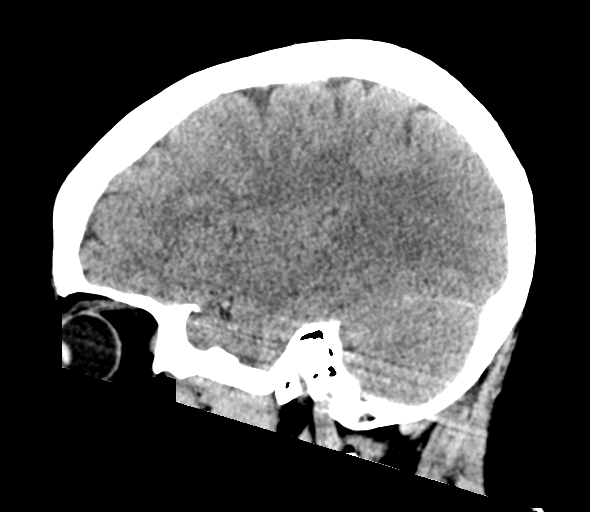
[im 30/60  brain]
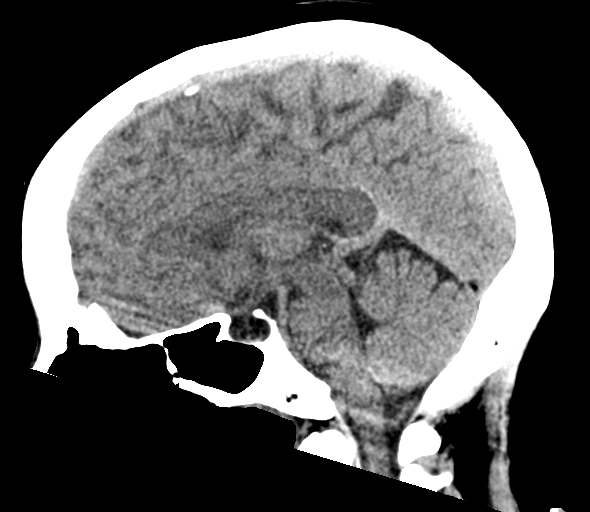
[im 40/60  brain]
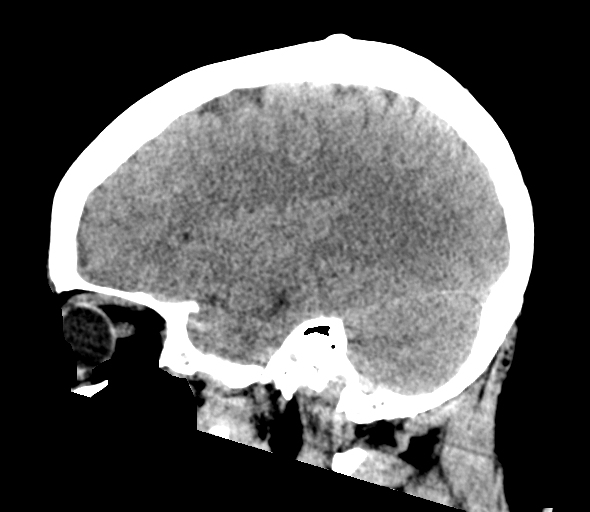

[17 of 47 positions shown; findings below may reference images not displayed]

FINDINGS: Brain: No acute intracranial abnormality. Specifically, no
hemorrhage, hydrocephalus, mass lesion, acute infarction, or
significant intracranial injury.

Vascular: No hyperdense vessel or unexpected calcification.

Skull: No acute calvarial abnormality.

Sinuses/Orbits: No acute findings

Other: None
IMPRESSION: Normal study.

## 2020-11-23 IMAGING — MR MR HEAD W/O CM
11 series · 40 of 48 positions shown · non-contrast
Comparison: Prior CT from earlier the same day.

CLINICAL DATA: Initial evaluation for acute left-sided facial palsy
with bilateral arm numbness.

EXAM:
MRI HEAD WITHOUT CONTRAST
TECHNIQUE: Multiplanar, multiecho pulse sequences of the brain and surrounding
structures were obtained without intravenous contrast.

[Series 5: ax dwi_tracew · axial · 3.0mm · 0.65mm/px · z∈[-74,+73]mm · 4 of 46 slices shown]
[im 1/46]
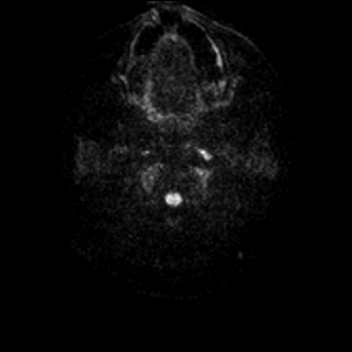
[im 16/46]
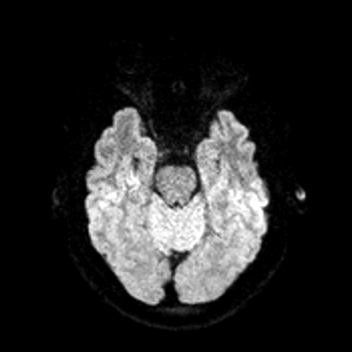
[im 31/46]
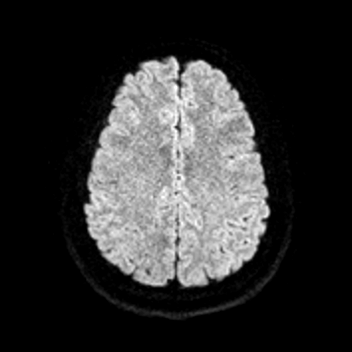
[im 46/46]
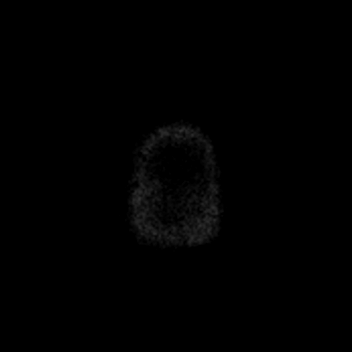

[Series 6: ax dwi_adc · axial · 3.0mm · 0.65mm/px · z∈[-74,+73]mm · 4 of 46 slices shown]
[im 1/46]
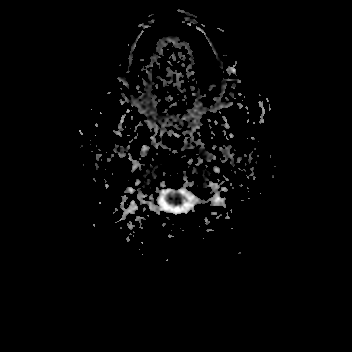
[im 16/46]
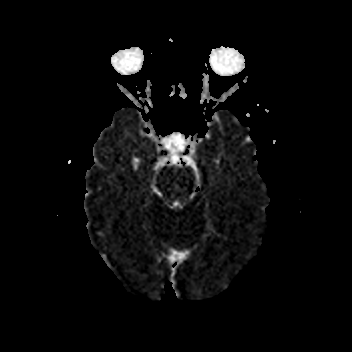
[im 31/46]
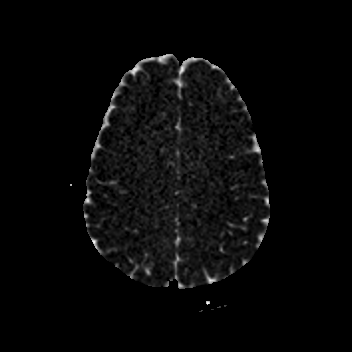
[im 46/46]
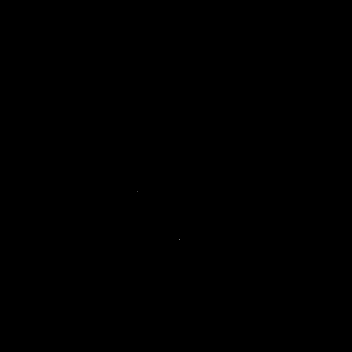

[Series 7: cor dwi_tracew · coronal · 5.0mm · 0.60mm/px · 3 of 36 slices shown]
[im 1/36]
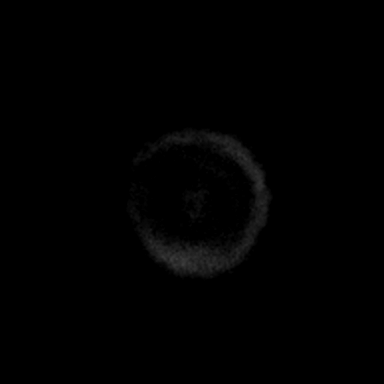
[im 18/36]
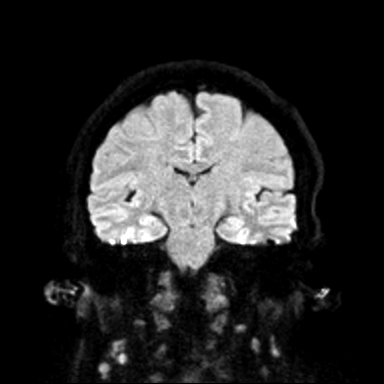
[im 36/36]
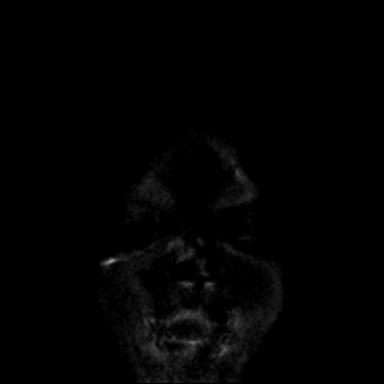

[Series 8: cor dwi_adc · coronal · 5.0mm · 0.60mm/px · 3 of 34 slices shown]
[im 1/34]
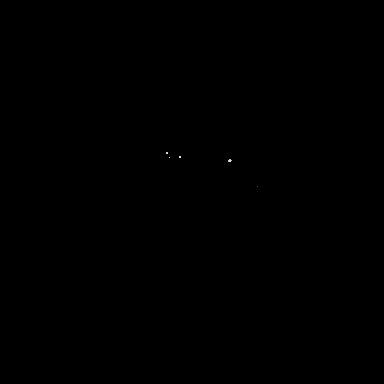
[im 17/34]
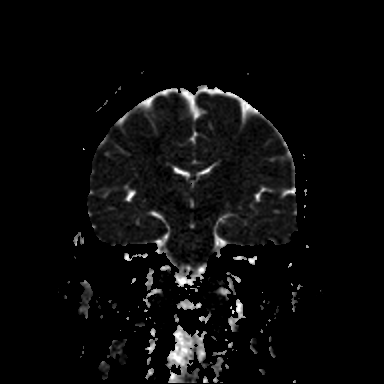
[im 34/34]
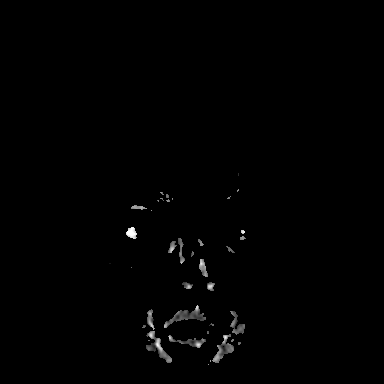

[Series 9: T1 · sagittal · 5.0mm · 0.62mm/px · 2 of 21 slices shown (1 of 2)]
[im 1/21]
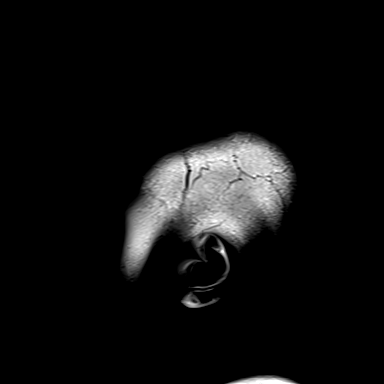
[im 21/21]
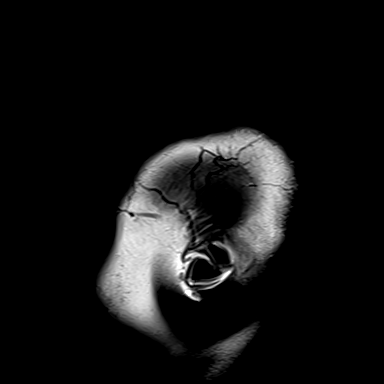

[Series 10: T2 · axial · 5.0mm · 0.45mm/px · z∈[-73,+70]mm · 2 of 25 slices shown (1 of 2)]
[im 1/25]
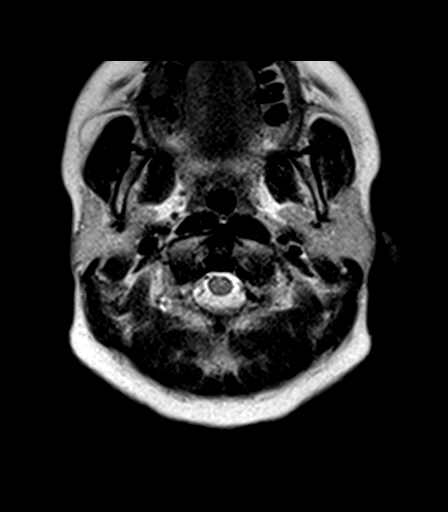
[im 25/25]
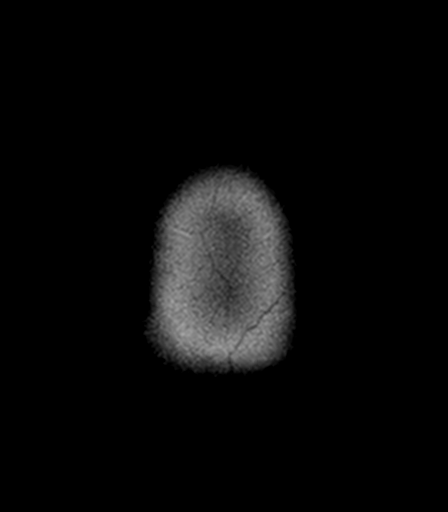

[Series 12: pha_images · axial · 3.0mm · 0.90mm/px · z∈[-89,+81]mm · 5 of 57 slices shown]
[im 1/57]
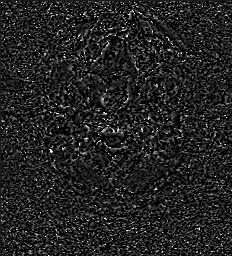
[im 15/57]
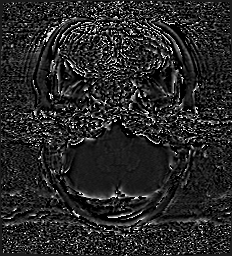
[im 29/57]
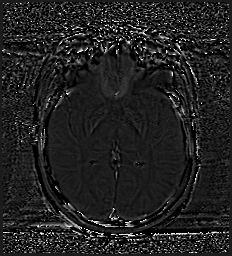
[im 43/57]
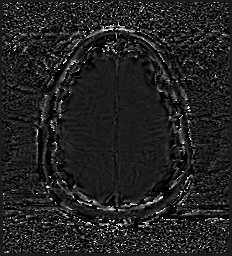
[im 57/57]
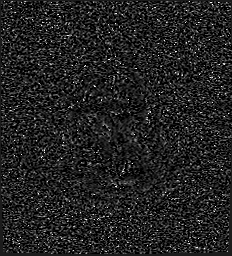

[Series 13: swi_images · axial · 3.0mm · 0.90mm/px · z∈[-89,-3]mm · 3 of 60 slices shown]
[im 1/60]
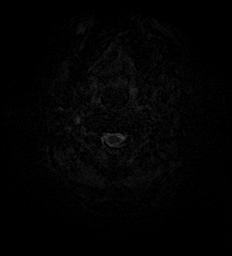
[im 15/60]
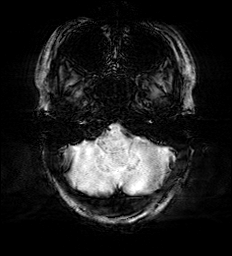
[im 30/60]
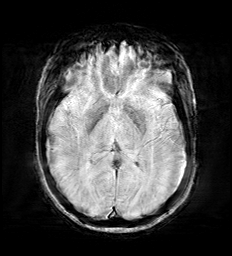

[Series 15: FLAIR · axial · 3.0mm · 0.53mm/px · z∈[-76,+73]mm · 4 of 51 slices shown]
[im 1/51]
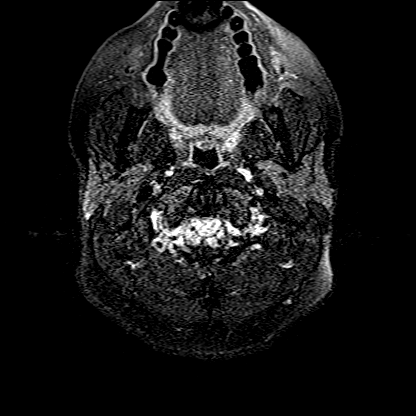
[im 17/51]
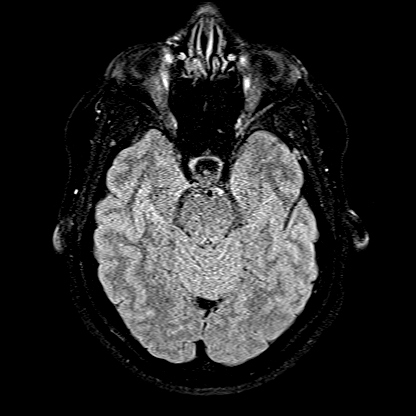
[im 34/51]
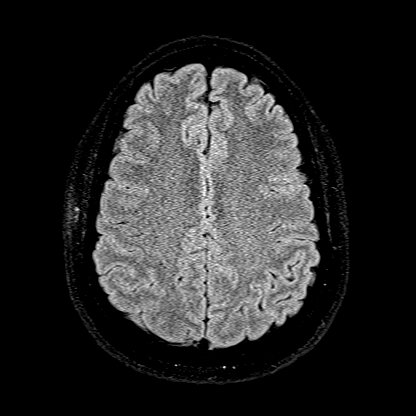
[im 51/51]
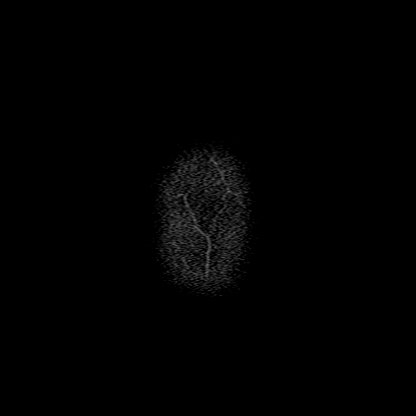

[Series 16: T1 · axial · 1.0mm · 0.98mm/px · z∈[-86,+87]mm · 8 of 175 slices shown (2 of 2)]
[im 1/175]
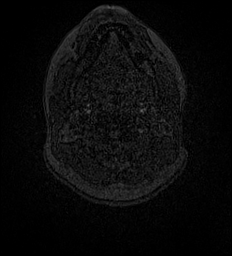
[im 27/175]
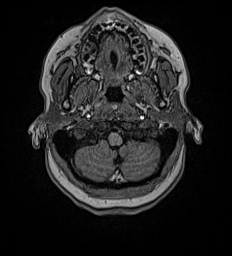
[im 54/175]
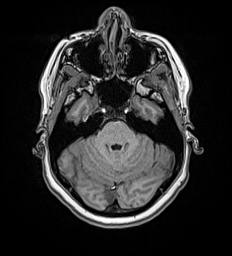
[im 81/175]
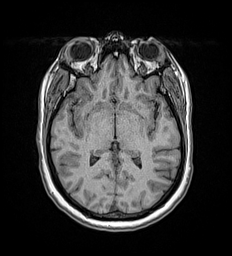
[im 94/175]
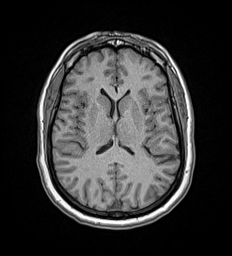
[im 121/175]
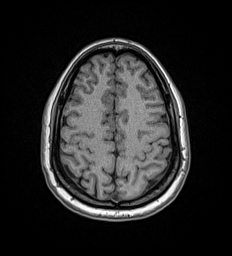
[im 148/175]
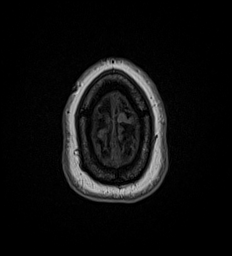
[im 175/175]
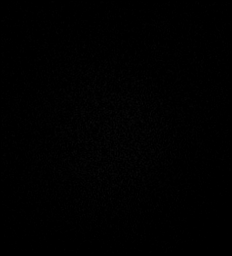

[Series 17: T2 · coronal · 5.0mm · 0.45mm/px · 2 of 29 slices shown (2 of 2)]
[im 1/29]
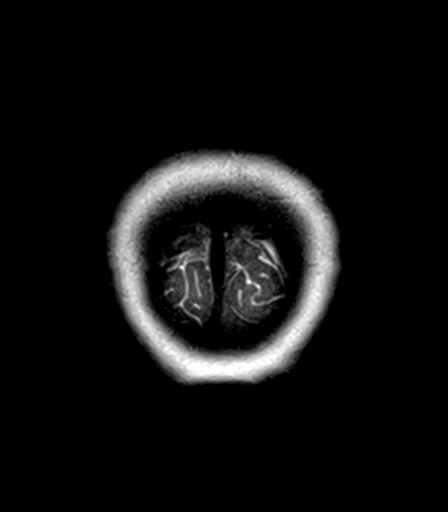
[im 29/29]
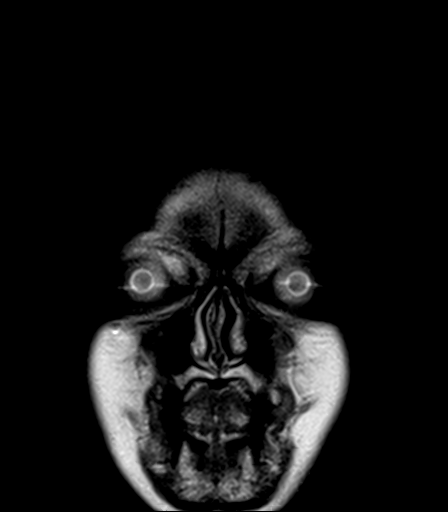

[40 of 48 positions shown; findings below may reference images not displayed]

FINDINGS: Brain: Cerebral volume within normal limits for age. Minimal
T2/FLAIR hyperintensity seen involving the pons, with a few
additional scattered subcentimeter foci involving the supratentorial
cerebral white matter. Findings are nonspecific, but most likely
related to chronic microvascular ischemic disease.

No abnormal foci of restricted diffusion to suggest acute or
subacute ischemia. Gray-white matter differentiation well
maintained. No encephalomalacia to suggest chronic cortical
infarction. No evidence for acute or chronic intracranial
hemorrhage.

No mass lesion, midline shift or mass effect. No hydrocephalus or
extra-axial fluid collection.

Empty sella noted.  Midline structures intact.

Vascular: Major intracranial vascular flow voids are maintained.

Skull and upper cervical spine: Craniocervical junction within
normal limits. Bone marrow signal intensity normal. No scalp soft
tissue abnormality.

Sinuses/Orbits: Globes and orbital soft tissues demonstrate no acute
finding. Paranasal sinuses are clear. No mastoid effusion. Inner ear
structures grossly normal.

Other: None.
IMPRESSION: 1. No acute intracranial abnormality.
2. Minimal T2/FLAIR hyperintensity involving the pons and
supratentorial cerebral white matter, nonspecific, but most likely
related to early/mild chronic microvascular ischemic disease.
3. Empty sella. While this finding is often incidental in nature and
of no clinical significance, this can also be seen in the setting of
idiopathic intracranial hypertension (pseudotumor cerebri).

## 2020-11-23 MED ORDER — KETOROLAC TROMETHAMINE 60 MG/2ML IM SOLN
15.0000 mg | Freq: Once | INTRAMUSCULAR | Status: AC
  Administered 2020-11-23: 15 mg via INTRAMUSCULAR
  Filled 2020-11-23: qty 2

## 2020-11-23 NOTE — ED Notes (Signed)
Patient returned from MRI.

## 2020-11-23 NOTE — ED Provider Notes (Signed)
[facility] Emergency Department Provider Note  ____________________________________________  Time seen: Approximately 10:48 PM  I have reviewed the triage vital signs and the nursing notes.   HISTORY  Chief Complaint Facial Droop and Numbness    HPI Roberta Hicks is a 45 y.o. female with a history of hypertension and migraine headaches who comes ED complaining of left-sided facial numbness and feeling of weakness in the left side of the face that started at about noon today.  Constant, no aggravating or alleviating factors.  No vision changes or motor weakness.  No new extremity paresthesias.  No falls or head trauma.  No neck stiffness, no fever.  Does also report a left-sided headache consistent with previous migraine headaches.    Past Medical History:  Diagnosis Date   Hypertension    Migraines      There are no problems to display for this patient.    Past Surgical History:  Procedure Laterality Date   TUBAL LIGATION       Prior to Admission medications   Medication Sig Start Date End Date Taking? Authorizing Provider  hydrocortisone cream 0.5 % Apply 1 application topically 2 (two) times daily. 02/20/15   Beers, Charmayne Sheer, PA-C  ondansetron (ZOFRAN ODT) 4 MG disintegrating tablet Take 1 tablet (4 mg total) by mouth every 8 (eight) hours as needed for nausea or vomiting. 10/31/17   Long, Arlyss Repress, MD  pantoprazole (PROTONIX) 20 MG tablet Take 1 tablet (20 mg total) by mouth daily. 10/31/17 11/30/17  Long, Arlyss Repress, MD  predniSONE (DELTASONE) 50 MG tablet Take 1 tablet (50 mg total) by mouth daily with breakfast. 04/02/18   Jene Every, MD  sucralfate (CARAFATE) 1 g tablet Take 1 tablet (1 g total) by mouth 4 (four) times daily -  with meals and at bedtime for 14 days. 10/31/17 11/14/17  Long, Arlyss Repress, MD  tobramycin (TOBREX) 0.3 % ophthalmic solution Place 2 drops into the left eye every 4 (four) hours. While awake 05/29/17   Tommi Rumps,  PA-C  venlafaxine XR (EFFEXOR-XR) 150 MG 24 hr capsule Take 150 mg by mouth daily with breakfast.    [provider]     Allergies Patient has no known allergies.   History reviewed. No pertinent family history.  Social History Social History   Tobacco Use   Smoking status: Every Day    Packs/day: 0.10    Pack years: 0.00    Types: Cigarettes   Smokeless tobacco: Never  Vaping Use   Vaping Use: Never used  Substance Use Topics   Alcohol use: Yes    Alcohol/week: 1.0 standard drink    Types: 1 Standard drinks or equivalent per week   Drug use: No    Review of Systems  Constitutional:   No fever or chills.  ENT:   No sore throat. No rhinorrhea. Cardiovascular:   No chest pain or syncope. Respiratory:   No dyspnea or cough. Gastrointestinal:   Negative for abdominal pain, vomiting and diarrhea.  Musculoskeletal:   Chronic bilateral carpal tunnel pain  all other systems reviewed and are negative except as documented above in ROS and HPI.  ____________________________________________   PHYSICAL EXAM:  VITAL SIGNS: ED Triage Vitals  Enc Vitals Group     BP 11/23/20 1827 (!) 167/98     Pulse Rate 11/23/20 1827 93     Resp 11/23/20 1827 20     Temp 11/23/20 1827 98.7 F (37.1 C)  Temp Source 11/23/20 1827 Oral     SpO2 11/23/20 1827 99 %     Weight 11/23/20 1827 195 lb (88.5 kg)     Height 11/23/20 1827 5\' 7"  (1.702 m)     Head Circumference --      Peak Flow --      Pain Score 11/23/20 1833 8     Pain Loc --      Pain Edu? --      Excl. in GC? --     Vital signs reviewed, nursing assessments reviewed.   Constitutional:   Alert and oriented. Non-toxic appearance. Eyes:   Conjunctivae are normal. EOMI. PERRL.  No nystagmus ENT      Head:   Normocephalic and atraumatic.  Able to raise eyebrows/forehead bilaterally      Nose:   Normal.      Mouth/Throat:   Normal, moist mucosa      Neck:   No meningismus. Full  ROM. Hematological/Lymphatic/Immunilogical:   No cervical lymphadenopathy. Cardiovascular:   RRR. Symmetric bilateral radial and DP pulses.  No murmurs. Cap refill less than 2 seconds. Respiratory:   Normal respiratory effort without tachypnea/retractions. Breath sounds are clear and equal bilaterally. No wheezes/rales/rhonchi. Gastrointestinal:   Soft and nontender. Non distended. There is no CVA tenderness.  No rebound, rigidity, or guarding. Genitourinary:   deferred Musculoskeletal:   Normal range of motion in all extremities. No joint effusions.  No lower extremity tenderness.  No edema. Neurologic:   Normal speech and language.  Cranial nerves III through XII intact Slight asymmetry of smile but no droop Motor grossly intact. No acute focal neurologic deficits are appreciated.  Skin:    Skin is warm, dry and intact. No rash noted.  No petechiae, purpura, or bullae.  ____________________________________________    LABS (pertinent positives/negatives) (all labs ordered are listed, but only abnormal results are displayed) Labs Reviewed  CBC - Abnormal; Notable for the following components:      Result Value   Platelets 413 (*)    All other components within normal limits  DIFFERENTIAL - Abnormal; Notable for the following components:   Lymphs Abs 4.2 (*)    All other components within normal limits  COMPREHENSIVE METABOLIC PANEL - Abnormal; Notable for the following components:   Potassium 3.3 (*)    Glucose, Bld 108 (*)    All other components within normal limits  CBG MONITORING, ED - Abnormal; Notable for the following components:   Glucose-Capillary 121 (*)    All other components within normal limits  PROTIME-INR  APTT   ____________________________________________   EKG  Interpreted by me Normal sinus rhythm rate of 96, normal axis and intervals.  Poor R wave progression.  Normal ST segments and T waves.  ____________________________________________     RADIOLOGY  CT HEAD WO CONTRAST  Result Date: 11/23/2020 CLINICAL DATA:  Facial palsy, bilateral arm numbness EXAM: CT HEAD WITHOUT CONTRAST TECHNIQUE: Contiguous axial images were obtained from the base of the skull through the vertex without intravenous contrast. COMPARISON:  11/28/2013 FINDINGS: Brain: No acute intracranial abnormality. Specifically, no hemorrhage, hydrocephalus, mass lesion, acute infarction, or significant intracranial injury. Vascular: No hyperdense vessel or unexpected calcification. Skull: No acute calvarial abnormality. Sinuses/Orbits: No acute findings Other: None IMPRESSION: Normal study. Electronically Signed   By: 11/30/2013 M.D.   On: 11/23/2020 19:01   MR BRAIN WO CONTRAST  Result Date: 11/23/2020 CLINICAL DATA:  Initial evaluation for acute left-sided facial palsy with bilateral arm numbness. EXAM:  MRI HEAD WITHOUT CONTRAST TECHNIQUE: Multiplanar, multiecho pulse sequences of the brain and surrounding structures were obtained without intravenous contrast. COMPARISON:  Prior CT from earlier the same day. FINDINGS: Brain: Cerebral volume within normal limits for age. Minimal T2/FLAIR hyperintensity seen involving the pons, with a few additional scattered subcentimeter foci involving the supratentorial cerebral white matter. Findings are nonspecific, but most likely related to chronic microvascular ischemic disease. No abnormal foci of restricted diffusion to suggest acute or subacute ischemia. Gray-white matter differentiation well maintained. No encephalomalacia to suggest chronic cortical infarction. No evidence for acute or chronic intracranial hemorrhage. No mass lesion, midline shift or mass effect. No hydrocephalus or extra-axial fluid collection. Empty sella noted.  Midline structures intact. Vascular: Major intracranial vascular flow voids are maintained. Skull and upper cervical spine: Craniocervical junction within normal limits. Bone marrow signal intensity  normal. No scalp soft tissue abnormality. Sinuses/Orbits: Globes and orbital soft tissues demonstrate no acute finding. Paranasal sinuses are clear. No mastoid effusion. Inner ear structures grossly normal. Other: None. IMPRESSION: 1. No acute intracranial abnormality. 2. Minimal T2/FLAIR hyperintensity involving the pons and supratentorial cerebral white matter, nonspecific, but most likely related to early/mild chronic microvascular ischemic disease. 3. Empty sella. While this finding is often incidental in nature and of no clinical significance, this can also be seen in the setting of idiopathic intracranial hypertension (pseudotumor cerebri). Electronically Signed   By: Rise Mu M.D.   On: 11/23/2020 21:57    ____________________________________________   PROCEDURES Procedures  ____________________________________________  DIFFERENTIAL DIAGNOSIS   Bell's palsy, stroke, complicated migraine, symptomatic hypertension  CLINICAL IMPRESSION / ASSESSMENT AND PLAN / ED COURSE  Medications ordered in the ED: Medications  ketorolac (TORADOL) injection 15 mg (15 mg Intramuscular Given 11/23/20 2105)    Pertinent labs & imaging results that were available during my care of the patient were reviewed by me and considered in my medical decision making (see chart for details).  Roberta Hicks was evaluated in Emergency Department on 11/23/2020 for the symptoms described in the history of present illness. She was evaluated in the context of the global COVID-19 pandemic, which necessitated consideration that the patient might be at risk for infection with the SARS-CoV-2 virus that causes COVID-19. Institutional protocols and algorithms that pertain to the evaluation of patients at risk for COVID-19 are in a state of rapid change based on information released by regulatory bodies including the CDC and federal and state organizations. These policies and algorithms were followed during the patient's  care in the ED.   Patient presents with vague left facial paresthesia symptoms, cranial nerves appear intact on exam.  No signs of stroke, but not clearly a Bell's palsy either.  No tick exposures or other insect bites, no signs of infection.  Doubt aneurysm intracranial hypertension meningitis or encephalitis.   Labs and CT head unremarkable.  MRI obtained which is also normal.  Stable for discharge, follow-up PCP.  After Toradol patient reports headache has resolved and she feels back to normal.  Smile symmetry has improved as well.     ____________________________________________   FINAL CLINICAL IMPRESSION(S) / ED DIAGNOSES    Final diagnoses:  Paresthesia  Bilateral carpal tunnel syndrome  Complicated migraine   ED Discharge Orders     None       Portions of this note were generated with dragon dictation software. Dictation errors may occur despite best attempts at proofreading.   Sharman Cheek, MD 11/23/20 2252

## 2020-11-23 NOTE — Discharge Instructions (Addendum)
Your lab tests, CT scan of the head, and MRI of the brain are all normal today.  Please follow-up with your primary care doctor for continued evaluation of your symptoms

## 2020-11-23 NOTE — ED Notes (Addendum)
Patient transported to MRI 

## 2020-11-23 NOTE — ED Triage Notes (Signed)
Pt via POV from home. Pt c/o L sided facial palsy and bilateral arm numbness. No drift or weakness present in any extremity. Denies pain. LKW was 12:00pm noon today. Pt is A&Ox4 and NAD.

## 2020-12-05 ENCOUNTER — Emergency Department: Payer: Self-pay

## 2020-12-05 ENCOUNTER — Other Ambulatory Visit: Payer: Self-pay

## 2020-12-05 ENCOUNTER — Emergency Department
Admission: EM | Admit: 2020-12-05 | Discharge: 2020-12-06 | Disposition: A | Discharge status: Home / Self Care | Payer: Self-pay | Attending: Emergency Medicine | Admitting: Emergency Medicine

## 2020-12-05 DIAGNOSIS — M5441 Lumbago with sciatica, right side: Secondary | ICD-10-CM | POA: Insufficient documentation

## 2020-12-05 DIAGNOSIS — G8929 Other chronic pain: Secondary | ICD-10-CM | POA: Insufficient documentation

## 2020-12-05 DIAGNOSIS — F17210 Nicotine dependence, cigarettes, uncomplicated: Secondary | ICD-10-CM | POA: Insufficient documentation

## 2020-12-05 DIAGNOSIS — I10 Essential (primary) hypertension: Secondary | ICD-10-CM | POA: Insufficient documentation

## 2020-12-05 DIAGNOSIS — Z79899 Other long term (current) drug therapy: Secondary | ICD-10-CM | POA: Insufficient documentation

## 2020-12-05 IMAGING — MR MR LUMBAR SPINE W/O CM
5 of 6 series · 24 of 48 positions shown · non-contrast
Comparison: Prior radiograph from [DATE].

CLINICAL DATA: Initial evaluation for acute low back pain.

EXAM:
MRI LUMBAR SPINE WITHOUT CONTRAST
TECHNIQUE: Multiplanar, multisequence MR imaging of the lumbar spine was
performed. No intravenous contrast was administered.

[Series 5: T2 · sagittal · 4.0mm · 0.75mm/px · 4 of 17 slices shown (1 of 2)]
[im 1/17]
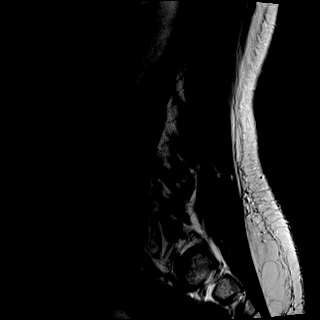
[im 6/17]
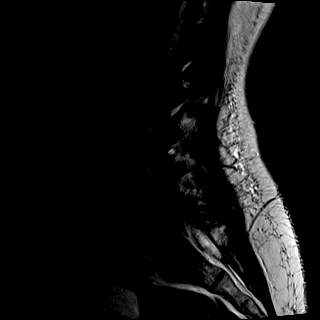
[im 11/17]
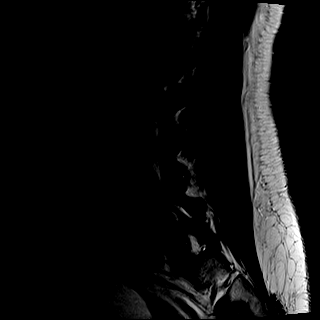
[im 17/17]
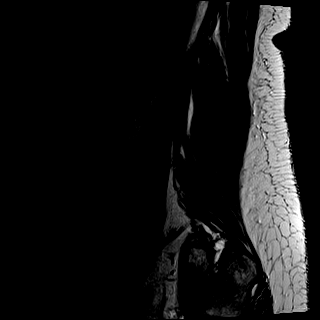

[Series 6: T1 · sagittal · 4.0mm · 0.75mm/px · 3 of 17 slices shown (1 of 2)]
[im 1/17]
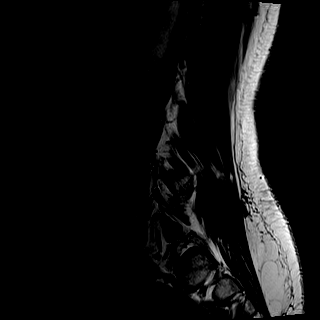
[im 9/17]
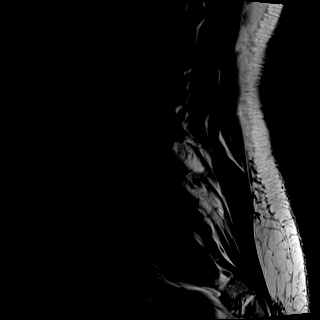
[im 17/17]
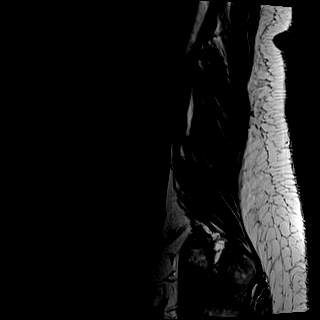

[Series 7: STIR · sagittal · 4.0mm · 0.38mm/px · 3 of 17 slices shown]
[im 1/17]
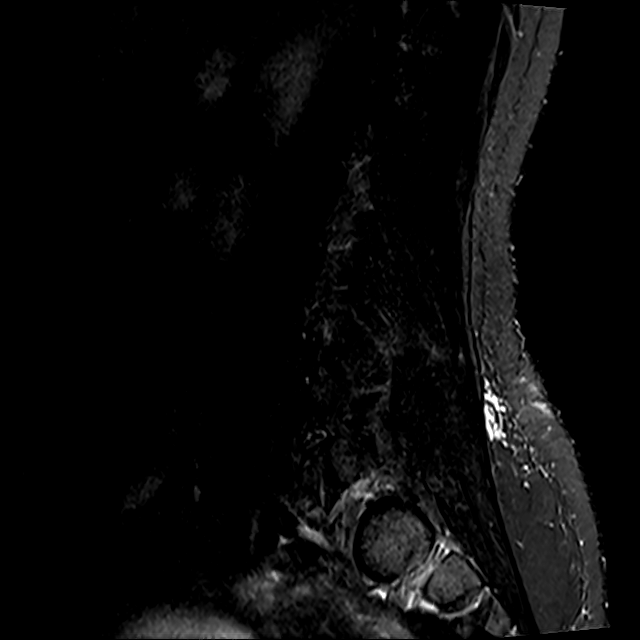
[im 9/17]
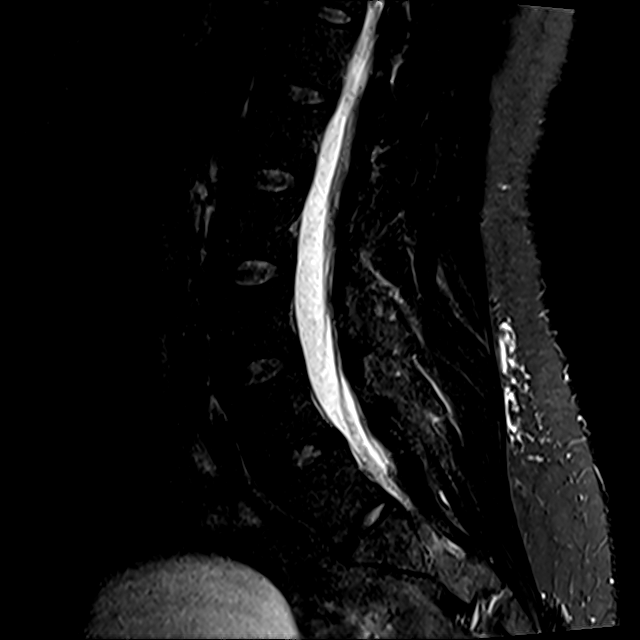
[im 17/17]
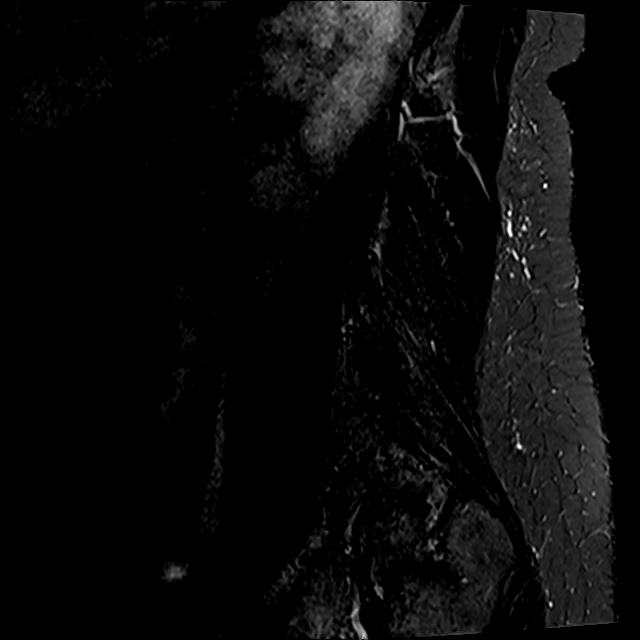

[Series 8: T2 · axial · 4.0mm · 0.78mm/px · z∈[-171,+55]mm · 7 of 36 slices shown (2 of 2)]
[im 1/36]
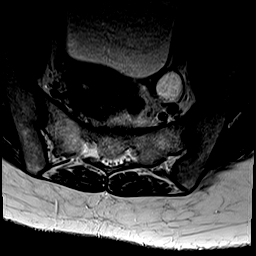
[im 6/36]
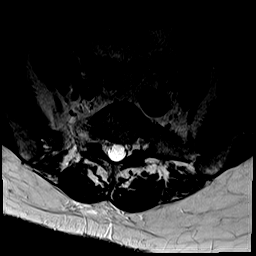
[im 12/36]
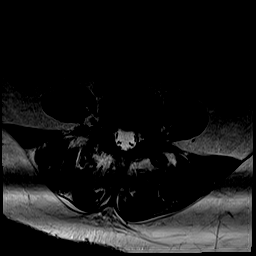
[im 18/36]
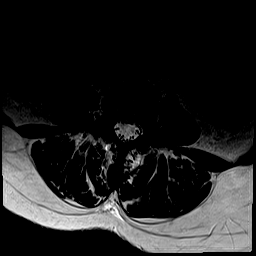
[im 24/36]
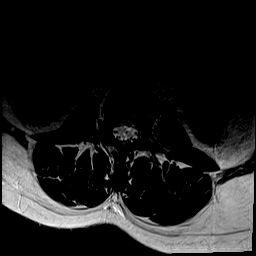
[im 30/36]
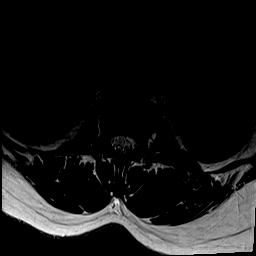
[im 36/36]
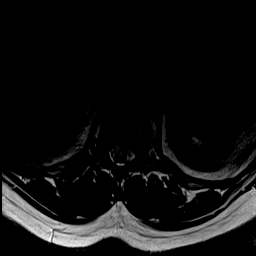

[Series 9: T1 · axial · 4.0mm · 0.39mm/px · z∈[-171,+55]mm · 7 of 36 slices shown (2 of 2)]
[im 1/36]
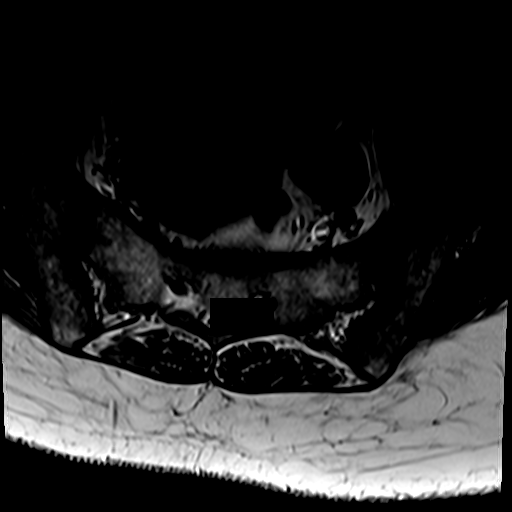
[im 6/36]
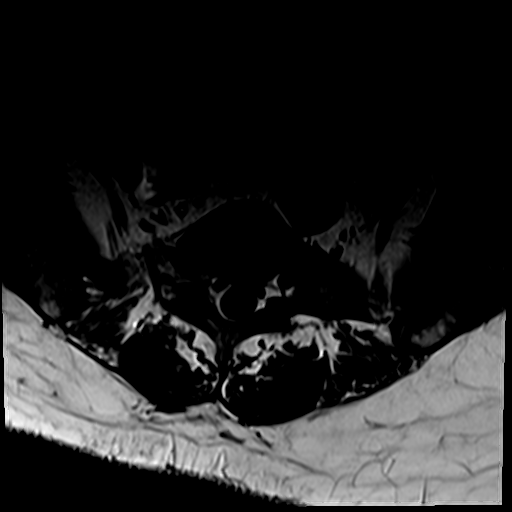
[im 12/36]
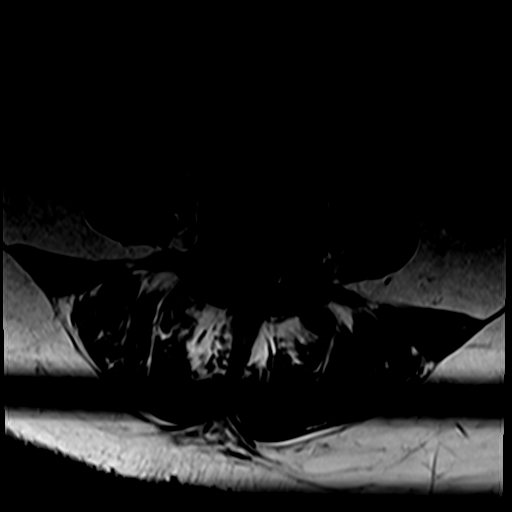
[im 18/36]
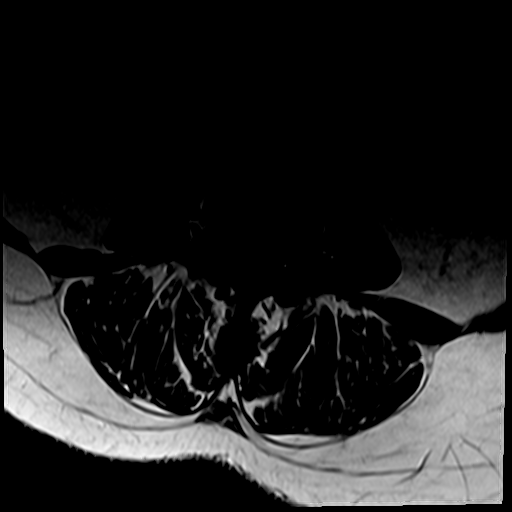
[im 24/36]
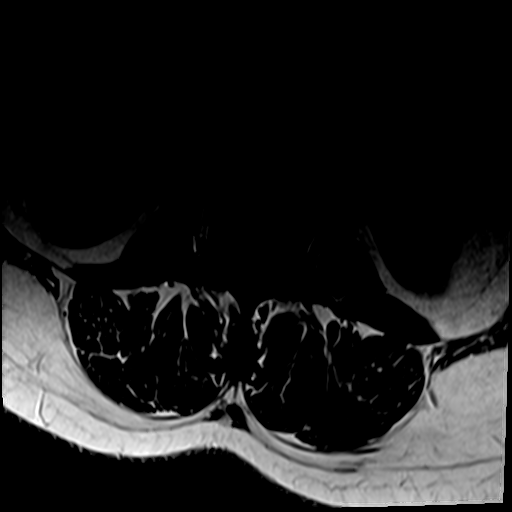
[im 30/36]
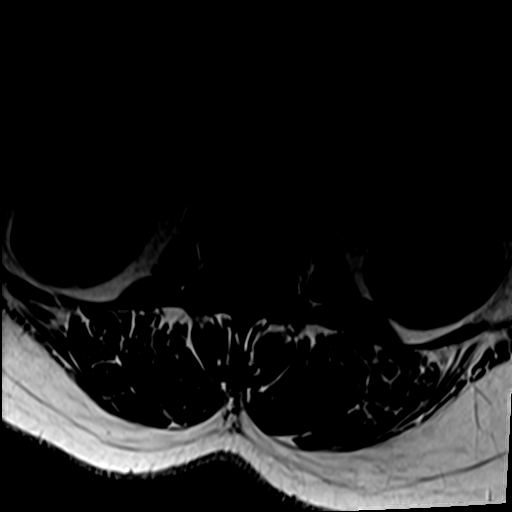
[im 36/36]
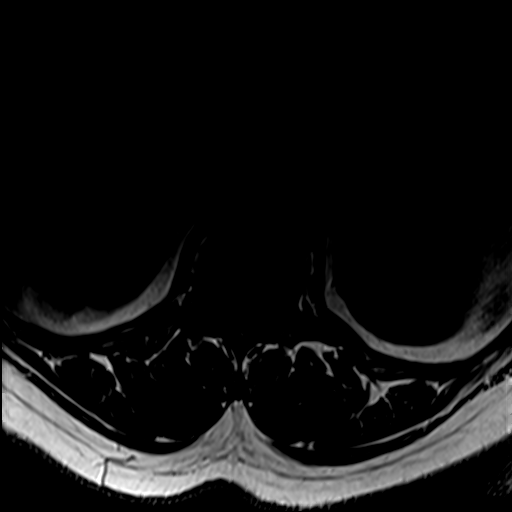

[24 of 48 positions shown; findings below may reference images not displayed]

FINDINGS: Segmentation: Transitional lumbosacral anatomy with partial
sacralization of the L5 vertebral body.

Alignment: 2 mm anterolisthesis of L4 on L5, chronic and facet
mediated. Alignment otherwise normal with preservation of the normal
lumbar lordosis.

Vertebrae: Vertebral body height maintained without acute or chronic
fracture. Bone marrow signal intensity within normal limits. No
discrete or worrisome osseous lesions. Mild reactive marrow edema
seen about the right L4-5 facets due to facet arthritis. No other
abnormal marrow edema.

Conus medullaris and cauda equina: Conus extends to the L1 level.
Conus and cauda equina appear normal.

Paraspinal and other soft tissues: Paraspinous soft tissues within
normal limits. 2.6 cm simple left adnexal cyst partially visualized
(series 8, image 36), nonspecific, but could reflect a functional
cyst. Visualized urinary bladder moderately distended. Visualized
visceral structures otherwise unremarkable.

Disc levels:

L1-2:  Unremarkable.

L2-3:  Unremarkable.

L3-4: Negative interspace. Minimal facet hypertrophy. No canal or
foraminal stenosis.

L4-5: 2 mm anterolisthesis. Disc desiccation with minimal disc
bulge. Moderate bilateral facet hypertrophy with associated joint
effusions. No significant spinal stenosis. Mild to moderate
bilateral L4 foraminal narrowing, worse on the right.

L5-S1: Negative interspace. Minimal facet spurring. No canal or
lateral recess stenosis. Foramina remain patent. No impingement.
IMPRESSION: 1. 2 mm anterolisthesis of L4 on L5 with associated mild disc bulge
and moderate facet hypertrophy, resulting in mild to moderate
bilateral L4 foraminal stenosis, right worse than left.
2. Reactive marrow edema about the right L4-5 facet due to facet
arthritis. Finding could contribute to lower back pain.
3. Mild bilateral facet spurring at L3-4 and L5-S1 without stenosis
or impingement.
4. 2.6 cm simple left adnexal cyst, partially visualized, but benign
in appearance. No follow-up imaging recommended. Note: This
recommendation does not apply to premenarchal patients and to those
with increased risk (genetic, family history, elevated tumor markers
or other high-risk factors) of ovarian cancer. Reference: JACR [X5]

## 2020-12-05 MED ORDER — ORPHENADRINE CITRATE 30 MG/ML IJ SOLN
60.0000 mg | INTRAMUSCULAR | Status: AC
  Administered 2020-12-05: 60 mg via INTRAMUSCULAR
  Filled 2020-12-05: qty 2

## 2020-12-05 MED ORDER — KETOROLAC TROMETHAMINE 30 MG/ML IJ SOLN
30.0000 mg | Freq: Once | INTRAMUSCULAR | Status: AC
  Administered 2020-12-05: 30 mg via INTRAMUSCULAR
  Filled 2020-12-05: qty 1

## 2020-12-05 NOTE — ED Notes (Signed)
Pt yelling out in pain in the lobby. This RN over to check on pt. Pt has ice packs. Pt states that they are no longer helping. Pt offered warm blankets to see if this would help with the pain, pt agreeable. Pt was given 2 warm blankets. One placed behind pts back and on pts hip.

## 2020-12-05 NOTE — ED Notes (Signed)
This NT attempted to take pt to room 50, pt declined and said her feet needed to be on a stool.  Pt insisted feet and body be moved at same time.  Pt advocate Harriett Rush assisted pt feet to stool and this NT wheeled pt wheelchair to room in sync.

## 2020-12-05 NOTE — ED Triage Notes (Signed)
Pt states she started having back pain 2 weeks ago- pt states that she was seen for this by her doctor and was given some muscle relaxers and then she ran out and they changed her medications to tramadol and that is not helping- pt is very tearful- pt states pain is also in her right leg and that it feels like a "charly horse"

## 2020-12-05 NOTE — ED Notes (Signed)
Pt given two ice bags. Pt and family asking for pain medicine.

## 2020-12-05 NOTE — ED Notes (Signed)
Pt resting in wheelchair at this time. Pt is in NAD.

## 2020-12-05 NOTE — ED Provider Notes (Signed)
[facility] Emergency Department Provider Note  ____________________________________________   Event Date/Time   First MD Initiated Contact with Patient 12/05/20 1728     (approximate)  I have reviewed the triage vital signs and the nursing notes.   HISTORY  Chief Complaint Back Pain  HPI Roberta Hicks is a 45 y.o. female with below medical history, presents to the ED for acute flare of chronic low back pain.  Patient via EMS from home, for evaluation of back pain which began approximately 2 weeks ago.  Patient was evaluated by her PCP, and discharged with muscle relaxants as well as pain medicines.  She denies any benefit from the medications at this time.  Patient is tearful related to the pain in her right lower extremity, and reports pain and disability with attempts to stand and walk.  She denies any bladder or bowel incontinence, foot drop, or saddle anesthesia.  She is unaware of her working diagnosis, noting that she is being followed by [facility], and more recently brother to family healthcare.  She does note that she is currently under charity care financial assistance program at [facility].  She has been on her own accord, being managed by a local chiropractor for her symptoms.  She denies any recent trauma, or falls.  Patient would endorse significant disability related to her pain, noting that she has been unable to work in the last several months secondary to the pain.  She also recently was diagnosed with Bell's palsy, and was concerned that the back pain may be related to that diagnosis.  She has not previously been evaluated with imaging of the low back to determine an underlying diagnosis.  Patient is under the instruction she has some sciatic nerve irritation based on information provided to her by her chiropractor.  She rates her pain at a 10 out of 10, and notes she is unable to move out of a supine position with the right leg partially  elevated without exquisite pain in the low back.  Past Medical History:  Diagnosis Date   Hypertension    Migraines     There are no problems to display for this patient.   Past Surgical History:  Procedure Laterality Date   TUBAL LIGATION      Prior to Admission medications   Medication Sig Start Date End Date Taking? Authorizing Provider  atorvastatin (LIPITOR) 40 MG tablet Take 40 mg by mouth daily.   Yes [provider]  cyclobenzaprine (FLEXERIL) 5 MG tablet Take 1 tablet (5 mg total) by mouth 3 (three) times daily as needed. 12/06/20  Yes Chesley Noon, MD  DULoxetine (CYMBALTA) 60 MG capsule Take 60 mg by mouth daily.   Yes [provider]  hydrochlorothiazide (HYDRODIURIL) 25 MG tablet Take 25 mg by mouth daily.   Yes [provider]  loperamide (IMODIUM) 2 MG capsule Take 2 mg by mouth as needed for diarrhea or loose stools.   Yes [provider]  oxyCODONE-acetaminophen (PERCOCET) 5-325 MG tablet Take 1 tablet by mouth every 4 (four) hours as needed for severe pain. 12/06/20 12/06/21 Yes Chesley Noon, MD  valACYclovir (VALTREX) 1000 MG tablet Take 1,000 mg by mouth 3 (three) times daily.   Yes [provider]  Vitamin D, Ergocalciferol, (DRISDOL) 1.25 MG (50000 UNIT) CAPS capsule Take 50,000 Units by mouth every 7 (seven) days.   Yes [provider]  hydrocortisone cream 0.5 % Apply 1 application topically 2 (two) times daily.  02/20/15   Beers, Charmayne Sheer, PA-C  pantoprazole (PROTONIX) 20 MG tablet Take 1 tablet (20 mg total) by mouth daily. 10/31/17 11/30/17  Long, Arlyss Repress, MD  sucralfate (CARAFATE) 1 g tablet Take 1 tablet (1 g total) by mouth 4 (four) times daily -  with meals and at bedtime for 14 days. 10/31/17 11/14/17  Maia Plan, MD  venlafaxine XR (EFFEXOR-XR) 150 MG 24 hr capsule Take 150 mg by mouth daily with breakfast.    [provider]    Allergies Patient has no known allergies.  History  reviewed. No pertinent family history.  Social History Social History   Tobacco Use   Smoking status: Every Day    Packs/day: 0.10    Pack years: 0.00    Types: Cigarettes   Smokeless tobacco: Never  Vaping Use   Vaping Use: Never used  Substance Use Topics   Alcohol use: Yes    Alcohol/week: 1.0 standard drink    Types: 1 Standard drinks or equivalent per week   Drug use: No    Review of Systems  Constitutional: No fever/chills Eyes: No visual changes. ENT: No sore throat. Cardiovascular: Denies chest pain. Respiratory: Denies shortness of breath. Gastrointestinal: No abdominal pain.  No nausea, no vomiting.  No diarrhea.  No constipation. Genitourinary: Negative for dysuria. Musculoskeletal: Positive for back pain with RLE referral. Skin: Negative for rash. Neurological: Negative for headaches, focal weakness or numbness. ____________________________________________   PHYSICAL EXAM:  VITAL SIGNS: ED Triage Vitals  Enc Vitals Group     BP 12/05/20 1511 (!) 151/128     Pulse Rate 12/05/20 1511 (!) 140     Resp 12/05/20 1511 (!) 24     Temp 12/05/20 1511 98.2 F (36.8 C)     Temp Source 12/05/20 1511 Oral     SpO2 12/05/20 1511 96 %     Weight 12/05/20 1521 195 lb (88.5 kg)     Height 12/05/20 1521 5\' 6"  (1.676 m)     Head Circumference --      Peak Flow --      Pain Score 12/05/20 1521 10     Pain Loc --      Pain Edu? --      Excl. in GC? --     Constitutional: Alert and oriented. Well appearing and in no acute distress. Eyes: Conjunctivae are normal. PERRL. EOMI. Head: Atraumatic. Nose: No congestion/rhinnorhea. Mouth/Throat: Mucous membranes are moist.  Oropharynx non-erythematous. Neck: No stridor.   Cardiovascular: Normal rate, regular rhythm. Grossly normal heart sounds.  Good peripheral circulation. Respiratory: Normal respiratory effort.  No retractions. Lungs CTAB. Gastrointestinal: Soft and nontender. No distention. No abdominal bruits. No  CVA tenderness. Musculoskeletal: No lower extremity tenderness nor edema.  No joint effusions. Neurologic:  Normal speech and language. No gross focal neurologic deficits are appreciated. No gait instability. Skin:  Skin is warm, dry and intact. No rash noted. Psychiatric: Mood and affect are normal. Speech and behavior are normal.  ____________________________________________   LABS (all labs ordered are listed, but only abnormal results are displayed)  Labs Reviewed - No data to display ____________________________________________  EKG  ____________________________________________  RADIOLOGY I, 12/07/20, personally viewed and evaluated these images (plain radiographs) as part of my medical decision making, as well as reviewing the written report by the radiologist.  ED MD interpretation:  agree with report  Official radiology report(s):  IMPRESSION: 1. 2 mm anterolisthesis of L4 on L5 with associated mild disc bulge  and moderate facet hypertrophy, resulting in mild to moderate bilateral L4 foraminal stenosis, right worse than left. 2. Reactive marrow edema about the right L4-5 facet due to facet arthritis. Finding could contribute to lower back pain. 3. Mild bilateral facet spurring at L3-4 and L5-S1 without stenosis or impingement. 4. 2.6 cm simple left adnexal cyst, partially visualized, but benign in appearance. No follow-up imaging recommended. Note: This recommendation does not apply to premenarchal patients and to those with increased risk (genetic, family history, elevated tumor markers or other high-risk factors) of ovarian cancer. Reference: JACR 2020 Feb; 17(2):248-254 ____________________________________________   PROCEDURES  Procedure(s) performed (including Critical Care):  Procedures  Toradol 30 mg IM Norflex 60 mg IM ____________________________________________   INITIAL IMPRESSION / ASSESSMENT AND PLAN / ED COURSE  As part of my  medical decision making, I reviewed the following data within the electronic MEDICAL RECORD NUMBER History obtained from family, Radiograph reviewed as above, Notes from prior ED visits, and [place] Controlled Substance Database    DDX: lumbar radiculopathy, HNP, sciatica  Patient ED evaluation of ongoing right-sided low back pain with right lower extremity referral, who presents to the ED for evaluation of her symptoms.  She was evaluated for complaint with an MRI which did confirm multilevel facet arthritis and significant marrow edema at L4-5 which could contribute patient's pain.  She otherwise is stable at this time is responded well to pain medicine provided in the ED.  She will be referred to her primary provider for ongoing management.  She is encouraged to establish with a neurologist in the [facility], as she has charity care through that system.  Return precautions have been discussed.  ____________________________________________   FINAL CLINICAL IMPRESSION(S) / ED DIAGNOSES  Final diagnoses:  Chronic right-sided low back pain with right-sided sciatica     ED Discharge Orders          Ordered    oxyCODONE-acetaminophen (PERCOCET) 5-325 MG tablet  Every 4 hours PRN        12/06/20 0110    cyclobenzaprine (FLEXERIL) 5 MG tablet  3 times daily PRN        12/06/20 0110             Note:  This document was prepared using Dragon voice recognition software and may include unintentional dictation errors.    Lissa Hoard, PA-C 12/16/20 1335    Sharyn Creamer, MD 12/17/20 [phone]

## 2020-12-05 NOTE — ED Notes (Signed)
This NT attempted to take pt to ED Room 50.  Pt refused and said "Don't touch me." Pt insisted her feet be placed on a stool for transport.  Pt advocate Georganna Skeans assisted with placing pt feet on stool and rolling stool as this NT pushed wheelchair.

## 2020-12-05 NOTE — ED Notes (Signed)
Pt yelling and screaming in pain. [name], EDT called to talk pt to flex room

## 2020-12-05 NOTE — ED Notes (Signed)
When attempting to place pt back in the lobby, pt raised her voice at Jonny Ruiz, ED tech and told him to leave, telling him that it was too painful to move her- this RN assisted John to get pt back to lobby to wait for a room

## 2020-12-05 NOTE — ED Notes (Signed)
Pt with chronic right back pain - seen several times recent - Roberta Hicks yesterday, without relief. Pain 10/10

## 2020-12-06 MED ORDER — OXYCODONE-ACETAMINOPHEN 5-325 MG PO TABS: 1.0000 | ORAL_TABLET | ORAL | 0 refills | Status: AC | PRN

## 2020-12-06 MED ORDER — OXYCODONE-ACETAMINOPHEN 5-325 MG PO TABS
1.0000 | ORAL_TABLET | Freq: Once | ORAL | Status: AC
  Administered 2020-12-06: 1 via ORAL
  Filled 2020-12-06: qty 1

## 2020-12-06 MED ORDER — CYCLOBENZAPRINE HCL 5 MG PO TABS: 5.0000 mg | ORAL_TABLET | Freq: Three times a day (TID) | ORAL | 0 refills | Status: AC | PRN

## 2020-12-06 NOTE — ED Notes (Signed)
Patient is refusing to leave "until this pain medicine kicks in."  Advised Charge of the patient's comment.

## 2020-12-06 NOTE — ED Notes (Signed)
Patient wheeled to car.  Patient crying during the transport saying "I've been hurting for three years, what am I going to do?"  RN attempted to get patient into the car, but there was so much pot smoke coming from the car, it made this RN sick to his stomach.  I was unable to assist.

## 2020-12-06 NOTE — ED Provider Notes (Signed)
-----------------------------------------   1:11 AM on 12/06/2020 ----------------------------------------- MRI shows L4 foraminal stenosis along with facet arthritis that could be contributing to patient's pain, no significant central stenosis noted.  On reassessment, she states that pain is improved.  Patient is appropriate for discharge home with outpatient follow-up, states she has established with physical therapy recently.  We will change her medication from tramadol to Percocet and she was additionally prescribed muscle relaxant.  She was counseled to return to the ED for new worsening symptoms, patient agrees with plan.   Chesley Noon, MD 12/06/20 [phone]

## 2020-12-06 NOTE — ED Notes (Signed)
EDP made aware of patient refusing to leave until pain medicine kicks on. EDP at bedside to speak with patient. Per EDP pt medically cleared and stable for discharge.

## 2021-04-10 ENCOUNTER — Other Ambulatory Visit: Payer: Medicaid Other
